# Patient Record
Sex: Female | Born: 1950 | Race: White | Hispanic: No | Marital: Married | Smoking: Never smoker
Health system: Southern US, Community
[De-identification: ages and names within clinical notes are randomized; demographics above are authoritative.]

## PROBLEM LIST (undated history)

## (undated) DIAGNOSIS — K219 Gastro-esophageal reflux disease without esophagitis: Secondary | ICD-10-CM

## (undated) DIAGNOSIS — E785 Hyperlipidemia, unspecified: Secondary | ICD-10-CM

## (undated) DIAGNOSIS — I10 Essential (primary) hypertension: Secondary | ICD-10-CM

## (undated) HISTORY — PX: REPLACEMENT TOTAL KNEE: SUR1224

## (undated) HISTORY — DX: Hyperlipidemia, unspecified: E78.5

## (undated) HISTORY — DX: Gastro-esophageal reflux disease without esophagitis: K21.9

## (undated) HISTORY — DX: Essential (primary) hypertension: I10

## (undated) HISTORY — PX: TUBAL LIGATION: SHX77

## (undated) HISTORY — PX: KNEE SURGERY: SHX244

## (undated) HISTORY — PX: CATARACT EXTRACTION: SUR2

## (undated) HISTORY — PX: OTHER SURGICAL HISTORY: SHX169

---

## 1995-06-21 HISTORY — PX: BREAST EXCISIONAL BIOPSY: SUR124

## 2015-06-02 DIAGNOSIS — J302 Other seasonal allergic rhinitis: Secondary | ICD-10-CM | POA: Insufficient documentation

## 2015-06-02 DIAGNOSIS — I10 Essential (primary) hypertension: Secondary | ICD-10-CM | POA: Insufficient documentation

## 2015-06-02 DIAGNOSIS — E785 Hyperlipidemia, unspecified: Secondary | ICD-10-CM | POA: Insufficient documentation

## 2015-06-02 DIAGNOSIS — M1990 Unspecified osteoarthritis, unspecified site: Secondary | ICD-10-CM | POA: Insufficient documentation

## 2015-06-02 DIAGNOSIS — K219 Gastro-esophageal reflux disease without esophagitis: Secondary | ICD-10-CM | POA: Insufficient documentation

## 2015-06-02 DIAGNOSIS — I83819 Varicose veins of unspecified lower extremities with pain: Secondary | ICD-10-CM | POA: Insufficient documentation

## 2015-06-02 DIAGNOSIS — Z79899 Other long term (current) drug therapy: Secondary | ICD-10-CM | POA: Insufficient documentation

## 2015-06-02 DIAGNOSIS — G43909 Migraine, unspecified, not intractable, without status migrainosus: Secondary | ICD-10-CM | POA: Insufficient documentation

## 2016-12-26 DIAGNOSIS — E7800 Pure hypercholesterolemia, unspecified: Secondary | ICD-10-CM | POA: Insufficient documentation

## 2016-12-26 DIAGNOSIS — I10 Essential (primary) hypertension: Secondary | ICD-10-CM | POA: Insufficient documentation

## 2016-12-26 DIAGNOSIS — G43009 Migraine without aura, not intractable, without status migrainosus: Secondary | ICD-10-CM | POA: Insufficient documentation

## 2017-05-18 ENCOUNTER — Other Ambulatory Visit: Payer: Self-pay | Admitting: Family Medicine

## 2017-05-18 DIAGNOSIS — Z1231 Encounter for screening mammogram for malignant neoplasm of breast: Secondary | ICD-10-CM

## 2017-06-14 ENCOUNTER — Encounter: Payer: Self-pay | Admitting: Radiology

## 2017-06-14 ENCOUNTER — Ambulatory Visit
Admission: RE | Admit: 2017-06-14 | Discharge: 2017-06-14 | Disposition: A | Discharge status: Home / Self Care | Payer: Medicare Other | Source: Ambulatory Visit | Attending: Family Medicine | Admitting: Family Medicine

## 2017-06-14 DIAGNOSIS — Z1231 Encounter for screening mammogram for malignant neoplasm of breast: Secondary | ICD-10-CM | POA: Insufficient documentation

## 2017-06-19 ENCOUNTER — Other Ambulatory Visit: Payer: Self-pay | Admitting: *Deleted

## 2017-06-19 ENCOUNTER — Inpatient Hospital Stay
Admission: RE | Admit: 2017-06-19 | Discharge: 2017-06-19 | Disposition: A | Discharge status: Home / Self Care | Payer: Self-pay | Source: Ambulatory Visit | Attending: *Deleted | Admitting: *Deleted

## 2017-06-19 DIAGNOSIS — Z9289 Personal history of other medical treatment: Secondary | ICD-10-CM

## 2018-05-08 ENCOUNTER — Other Ambulatory Visit: Payer: Self-pay | Admitting: Family Medicine

## 2018-05-08 DIAGNOSIS — Z1231 Encounter for screening mammogram for malignant neoplasm of breast: Secondary | ICD-10-CM

## 2018-06-15 ENCOUNTER — Ambulatory Visit
Admission: RE | Admit: 2018-06-15 | Discharge: 2018-06-15 | Disposition: A | Discharge status: Home / Self Care | Payer: Medicare Other | Source: Ambulatory Visit | Attending: Family Medicine | Admitting: Family Medicine

## 2018-06-15 DIAGNOSIS — Z1231 Encounter for screening mammogram for malignant neoplasm of breast: Secondary | ICD-10-CM | POA: Diagnosis not present

## 2019-07-31 ENCOUNTER — Other Ambulatory Visit: Payer: Self-pay | Admitting: Family Medicine

## 2019-07-31 DIAGNOSIS — Z1231 Encounter for screening mammogram for malignant neoplasm of breast: Secondary | ICD-10-CM

## 2019-10-15 ENCOUNTER — Ambulatory Visit
Admission: RE | Admit: 2019-10-15 | Discharge: 2019-10-15 | Disposition: A | Discharge status: Home / Self Care | Payer: Medicare Other | Source: Ambulatory Visit | Attending: Family Medicine | Admitting: Family Medicine

## 2019-10-15 DIAGNOSIS — Z1231 Encounter for screening mammogram for malignant neoplasm of breast: Secondary | ICD-10-CM | POA: Diagnosis not present

## 2020-09-18 ENCOUNTER — Other Ambulatory Visit: Payer: Self-pay | Admitting: Family Medicine

## 2020-09-18 DIAGNOSIS — Z1231 Encounter for screening mammogram for malignant neoplasm of breast: Secondary | ICD-10-CM

## 2020-10-15 ENCOUNTER — Ambulatory Visit
Admission: RE | Admit: 2020-10-15 | Discharge: 2020-10-15 | Disposition: A | Discharge status: Home / Self Care | Payer: Medicare Other | Source: Ambulatory Visit | Attending: Family Medicine | Admitting: Family Medicine

## 2020-10-15 ENCOUNTER — Other Ambulatory Visit: Payer: Self-pay

## 2020-10-15 DIAGNOSIS — Z1231 Encounter for screening mammogram for malignant neoplasm of breast: Secondary | ICD-10-CM | POA: Diagnosis present

## 2021-03-25 ENCOUNTER — Telehealth: Payer: Self-pay

## 2021-03-25 ENCOUNTER — Encounter: Payer: Self-pay | Admitting: Internal Medicine

## 2021-03-25 ENCOUNTER — Other Ambulatory Visit: Payer: Self-pay

## 2021-03-25 ENCOUNTER — Ambulatory Visit (INDEPENDENT_AMBULATORY_CARE_PROVIDER_SITE_OTHER): Payer: Medicare Other | Admitting: Nurse Practitioner

## 2021-03-25 VITALS — BP 140/80 | HR 77 | Temp 98.2°F | Resp 16 | Ht 63.5 in | Wt 164.0 lb

## 2021-03-25 DIAGNOSIS — Z780 Asymptomatic menopausal state: Secondary | ICD-10-CM | POA: Diagnosis not present

## 2021-03-25 DIAGNOSIS — Z7689 Persons encountering health services in other specified circumstances: Secondary | ICD-10-CM

## 2021-03-25 DIAGNOSIS — E559 Vitamin D deficiency, unspecified: Secondary | ICD-10-CM | POA: Diagnosis not present

## 2021-03-25 DIAGNOSIS — E538 Deficiency of other specified B group vitamins: Secondary | ICD-10-CM

## 2021-03-25 DIAGNOSIS — Z23 Encounter for immunization: Secondary | ICD-10-CM

## 2021-03-25 DIAGNOSIS — R600 Localized edema: Secondary | ICD-10-CM | POA: Diagnosis not present

## 2021-03-25 DIAGNOSIS — E782 Mixed hyperlipidemia: Secondary | ICD-10-CM

## 2021-03-25 MED ORDER — TETANUS-DIPHTH-ACELL PERTUSSIS 5-2-15.5 LF-MCG/0.5 IM SUSP: 0.5000 mL | Freq: Once | INTRAMUSCULAR | 0 refills | Status: AC

## 2021-03-25 NOTE — Telephone Encounter (Signed)
scheduled bone density with Jordan Carpenter @ [facility] for 04/15/21 @ 2:20. I notified patient-Jordan Carpenter

## 2021-03-25 NOTE — Progress Notes (Signed)
[facility] [address] [place], [place] [address]  Internal MEDICINE  Office Visit Note  Patient Name: Jordan Carpenter  04/30/1951  [id number]  Date of Service: 03/25/2021   Complaints/HPI Pt is here for establishment of PCP. Chief Complaint  Patient presents with   New Patient (Initial Visit)   Hyperlipidemia   Hypertension   HPI Brendan presents for a new patient visit to establish care. She has a history of hypertension, hyperlipidemia and GERD. Her surgical history is significant for bilateral cataract extraction, back surgery in 2004, left knee surgery x2, and a negative right breast biopsy in 1997. She has also had a tubal ligation. She lives at home with her husband. She has 2 grown sons and 1 grown daughter. She has 4 grandchildren. 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 She reports that her diet is ok and she walks for exercise. She is a nonsmoker, drinks wine occasionally and denies any recreational drug use. She is waiting until November to get the flu vaccine. She has had 4 doses of the covid vaccine. She had a routine colonoscopy in 2021.  He has chronic swelling in her lower legs, she has been told that it is lymphedema. It ha been an issue since she has had knee surgery on both knees.  She is due for BMD screening, routine labs and the tetanus vaccine.     ABI at next visit, and consider vas surg or lymphedema.   Current Medication: Outpatient Encounter Medications as of 03/25/2021  Medication Sig   cetirizine (ZYRTEC) 10 MG tablet Take by mouth.   Coenzyme Q10 400 MG CAPS Take by mouth.   cyanocobalamin 1000 MCG tablet Take by mouth.   Elderberry 500 MG CAPS Take by mouth.   fluticasone (FLONASE) 50 MCG/ACT nasal spray    lisinopril-hydrochlorothiazide (ZESTORETIC) 20-25 MG tablet Take 1 tablet by mouth daily.   pantoprazole (PROTONIX) 40 MG tablet Take 1 tablet by mouth 2 (two) times daily.   rosuvastatin (CRESTOR) 40 MG tablet    [EXPIRED] Tdap (ADACEL) 10-19-13.5  LF-MCG/0.5 injection Inject 0.5 mLs into the muscle once for 1 dose.   [DISCONTINUED] COVID-19 mRNA vaccine, Moderna, 100 MCG/0.5ML injection    No facility-administered encounter medications on file as of 03/25/2021.    Surgical History: Past Surgical History:  Procedure Laterality Date   BACK SURGERY Left    2004   BREAST EXCISIONAL BIOPSY Right 1997   neg   CATARACT EXTRACTION Bilateral    eye lift Left    KNEE SURGERY Left    2007 AGAIN   REPLACEMENT TOTAL KNEE     TUBAL LIGATION      Medical History: Past Medical History:  Diagnosis Date   GERD (gastroesophageal reflux disease)    Hyperlipidemia    Hypertension     Family History: Family History  Problem Relation Age of Onset   Lung cancer Mother    Lymphoma Father    Lung cancer Brother    Breast cancer Maternal Aunt 60   Ovarian cancer Paternal Aunt    Kidney cancer Maternal Grandmother    Breast cancer Paternal Grandmother     Social History   Socioeconomic History   Marital status: Married    Spouse name: Not on file   Number of children: Not on file   Years of education: Not on file   Highest education level: Not on file  Occupational History   Not on file  Tobacco Use   Smoking status: Never      Passive exposure: Never   Smokeless tobacco: Never  Substance and Sexual Activity   Alcohol use: Yes    Alcohol/week: 1.0 standard drink    Types: 1 Glasses of wine per week   Drug use: Not on file   Sexual activity: Not on file  Other Topics Concern   Not on file  Social History Narrative   Not on file   Social Determinants of Health   Financial Resource Strain: Not on file  Food Insecurity: Not on file  Transportation Needs: Not on file  Physical Activity: Not on file  Stress: Not on file  Social Connections: Not on file  Intimate Partner Violence: Not on file     Review of Systems  Constitutional:  Negative for chills, fatigue and unexpected weight change.  HENT:  Negative for  congestion, rhinorrhea, sneezing and sore throat.   Eyes:  Negative for redness.  Respiratory:  Negative for cough, chest tightness and shortness of breath.   Cardiovascular:  Negative for chest pain and palpitations.  Gastrointestinal:  Negative for abdominal pain, constipation, diarrhea, nausea and vomiting.  Genitourinary:  Negative for dysuria and frequency.  Musculoskeletal:  Positive for arthralgias, back pain and gait problem. Negative for joint swelling and neck pain.  Skin:  Negative for rash.  Neurological:  Negative for tremors and numbness.  Hematological:  Negative for adenopathy. Does not bruise/bleed easily.  Psychiatric/Behavioral:  Negative for behavioral problems (Depression), sleep disturbance and suicidal ideas. The patient is not nervous/anxious.    Vital Signs: BP 140/80   Pulse 77   Temp 98.2 F (36.8 C)   Resp 16   Ht 5' 3.5" (1.613 m)   Wt 164 lb (74.4 kg)   SpO2 97%   BMI 28.60 kg/m    Physical Exam Vitals reviewed.  Constitutional:      General: She is not in acute distress.    Appearance: Normal appearance. She is normal weight. She is not ill-appearing.  HENT:     Head: Normocephalic and atraumatic.  Eyes:     Extraocular Movements: Extraocular movements intact.     Pupils: Pupils are equal, round, and reactive to light.  Pulmonary:     Effort: Pulmonary effort is normal. No respiratory distress.  Musculoskeletal:     Right lower leg: Edema present.     Left lower leg: Edema present.  Neurological:     Mental Status: She is alert and oriented to person, place, and time.     Cranial Nerves: No cranial nerve deficit.     Coordination: Coordination normal.     Gait: Gait normal.  Psychiatric:        Mood and Affect: Mood normal.        Behavior: Behavior normal.      Assessment/Plan: 1. Bilateral lower extremity edema Chronic swelling since her knee surgeries. Will do ABIs at next office visit, and consider consult to wound clinic for  lymphedema consultation.   2. Asymptomatic menopausal state BMD screening due, imaging ordered. Routine labs ordered.  - DG Bone Density; Future - CBC with Differential/Platelet - CMP14+EGFR - TSH + free T4  3. Vitamin D deficiency Rule out low vitamin D.  - Vitamin D (25 hydroxy)  4. B12 deficiency Routine labs ordered - CBC with Differential/Platelet - B12 and Folate Panel  5. Mixed hyperlipidemia Routine labs ordered - CMP14+EGFR - Lipid Profile  6. Need for tetanus booster - Tdap (ADACEL) 10-19-13.5 LF-MCG/0.5 injection; Inject 0.5 mLs into the muscle once for 1   dose.  Dispense: 0.5 mL; Refill: 0  7. Encounter to establish care with new doctor Establish with new PCP.    General Counseling: Kei verbalizes understanding of the findings of todays visit and agrees with plan of treatment. I have discussed any further diagnostic evaluation that may be needed or ordered today. We also reviewed her medications today. she has been encouraged to call the office with any questions or concerns that should arise related to todays visit.    Counseling:  [place] Controlled Substance Database was reviewed by me.  Orders Placed This Encounter  Procedures   DG Bone Density   CBC with Differential/Platelet   CMP14+EGFR   Lipid Profile   TSH + free T4   Vitamin D (25 hydroxy)   B12 and Folate Panel    Meds ordered this encounter  Medications   Tdap (ADACEL) 10-19-13.5 LF-MCG/0.5 injection    Sig: Inject 0.5 mLs into the muscle once for 1 dose.    Dispense:  0.5 mL    Refill:  0    Return in about 4 weeks (around 04/22/2021) for CPE, Review labs/test, Alyssa PCP.  Time spent:30 Minutes  This patient was seen by Alyssa Abernathy, FNP-C in collaboration with Dr. Fozia Khan as a part of collaborative care agreement.    Alyssa R. Abernathy, MSN, FNP-C Internal Medicine  

## 2021-03-30 LAB — CMP14+EGFR
ALT: 19 IU/L (ref 0–32)
AST: 22 IU/L (ref 0–40)
Albumin/Globulin Ratio: 1.9 (ref 1.2–2.2)
Albumin: 4.3 g/dL (ref 3.7–4.7)
Alkaline Phosphatase: 84 IU/L (ref 44–121)
BUN/Creatinine Ratio: 26 (ref 12–28)
BUN: 18 mg/dL (ref 8–27)
Bilirubin Total: 0.6 mg/dL (ref 0.0–1.2)
CO2: 26 mmol/L (ref 20–29)
Calcium: 9.7 mg/dL (ref 8.7–10.3)
Chloride: 97 mmol/L (ref 96–106)
Creatinine, Ser: 0.68 mg/dL (ref 0.57–1.00)
Globulin, Total: 2.3 g/dL (ref 1.5–4.5)
Glucose: 82 mg/dL (ref 70–99)
Potassium: 3.8 mmol/L (ref 3.5–5.2)
Sodium: 136 mmol/L (ref 134–144)
Total Protein: 6.6 g/dL (ref 6.0–8.5)
eGFR: 93 mL/min/{1.73_m2} (ref >59)

## 2021-03-30 LAB — CBC WITH DIFFERENTIAL/PLATELET
Basophils Absolute: 0.1 10*3/uL (ref 0.0–0.2)
Basos: 1 %
EOS (ABSOLUTE): 0.1 10*3/uL (ref 0.0–0.4)
Eos: 1 %
Hematocrit: 42.6 % (ref 34.0–46.6)
Hemoglobin: 14 g/dL (ref 11.1–15.9)
Immature Grans (Abs): 0 10*3/uL (ref 0.0–0.1)
Immature Granulocytes: 0 %
Lymphocytes Absolute: 1.8 10*3/uL (ref 0.7–3.1)
Lymphs: 27 %
MCH: 27.8 pg (ref 26.6–33.0)
MCHC: 32.9 g/dL (ref 31.5–35.7)
MCV: 85 fL (ref 79–97)
Monocytes Absolute: 0.4 10*3/uL (ref 0.1–0.9)
Monocytes: 7 %
Neutrophils Absolute: 4.1 10*3/uL (ref 1.4–7.0)
Neutrophils: 64 %
Platelets: 241 10*3/uL (ref 150–450)
RBC: 5.04 x10E6/uL (ref 3.77–5.28)
RDW: 13.2 % (ref 11.7–15.4)
WBC: 6.5 10*3/uL (ref 3.4–10.8)

## 2021-03-30 LAB — LIPID PANEL
Chol/HDL Ratio: 2.8 ratio (ref 0.0–4.4)
Cholesterol, Total: 236 mg/dL — ABNORMAL HIGH (ref 100–199)
HDL: 84 mg/dL (ref >39)
LDL Chol Calc (NIH): 140 mg/dL — ABNORMAL HIGH (ref 0–99)
Triglycerides: 73 mg/dL (ref 0–149)
VLDL Cholesterol Cal: 12 mg/dL (ref 5–40)

## 2021-03-30 LAB — B12 AND FOLATE PANEL
Folate: 11.6 ng/mL (ref >3.0)
Vitamin B-12: 1333 pg/mL — ABNORMAL HIGH (ref 232–1245)

## 2021-03-30 LAB — VITAMIN D 25 HYDROXY (VIT D DEFICIENCY, FRACTURES): Vit D, 25-Hydroxy: 52 ng/mL (ref 30.0–100.0)

## 2021-03-30 LAB — TSH+FREE T4
Free T4: 1.4 ng/dL (ref 0.82–1.77)
TSH: 1.31 u[IU]/mL (ref 0.450–4.500)

## 2021-04-14 NOTE — Progress Notes (Signed)
I have reviewed the lab results. There are no critically abnormal values requiring immediate intervention but there are some abnormals that will be discussed at the next office visit.   The lipid levels are abnormal. Some interventions that can be done to improve lipid levels include eating leaner proteins, decreasing red meats in diet, adding an OTC fish oil supplement, weight loss, and increasing physical activity. Elevated lipid levels can increase the risk of atherosclerotic cardiovascular disease as well so moderate or high intensity statin therapy may also be recommended.   We will discuss in detail at your upcoming office visit on 11/10

## 2021-04-15 ENCOUNTER — Other Ambulatory Visit: Payer: Medicare Other

## 2021-04-29 ENCOUNTER — Ambulatory Visit: Payer: Medicare Other | Admitting: Nurse Practitioner

## 2021-05-06 ENCOUNTER — Other Ambulatory Visit: Payer: Self-pay

## 2021-05-06 ENCOUNTER — Ambulatory Visit
Admission: RE | Admit: 2021-05-06 | Discharge: 2021-05-06 | Disposition: A | Discharge status: Home / Self Care | Payer: Medicare Other | Source: Ambulatory Visit | Attending: Nurse Practitioner | Admitting: Nurse Practitioner

## 2021-05-06 DIAGNOSIS — Z780 Asymptomatic menopausal state: Secondary | ICD-10-CM | POA: Diagnosis present

## 2021-07-20 NOTE — Progress Notes (Signed)
Will discuss with patient at next office visit. 

## 2021-07-26 ENCOUNTER — Ambulatory Visit (INDEPENDENT_AMBULATORY_CARE_PROVIDER_SITE_OTHER): Payer: Medicare Other | Admitting: Nurse Practitioner

## 2021-07-26 ENCOUNTER — Encounter: Payer: Self-pay | Admitting: Nurse Practitioner

## 2021-07-26 ENCOUNTER — Other Ambulatory Visit: Payer: Self-pay

## 2021-07-26 VITALS — BP 106/78 | HR 70 | Temp 98.7°F | Resp 16 | Ht 64.0 in | Wt 168.6 lb

## 2021-07-26 DIAGNOSIS — I10 Essential (primary) hypertension: Secondary | ICD-10-CM | POA: Diagnosis not present

## 2021-07-26 DIAGNOSIS — K219 Gastro-esophageal reflux disease without esophagitis: Secondary | ICD-10-CM

## 2021-07-26 DIAGNOSIS — Z0001 Encounter for general adult medical examination with abnormal findings: Secondary | ICD-10-CM | POA: Diagnosis not present

## 2021-07-26 DIAGNOSIS — R0989 Other specified symptoms and signs involving the circulatory and respiratory systems: Secondary | ICD-10-CM | POA: Diagnosis not present

## 2021-07-26 DIAGNOSIS — E782 Mixed hyperlipidemia: Secondary | ICD-10-CM | POA: Diagnosis not present

## 2021-07-26 DIAGNOSIS — I8393 Asymptomatic varicose veins of bilateral lower extremities: Secondary | ICD-10-CM | POA: Diagnosis not present

## 2021-07-26 DIAGNOSIS — R300 Dysuria: Secondary | ICD-10-CM

## 2021-07-26 MED ORDER — ROSUVASTATIN CALCIUM 40 MG PO TABS: 40.0000 mg | ORAL_TABLET | Freq: Every day | ORAL | 1 refills | Status: DC

## 2021-07-26 MED ORDER — LISINOPRIL-HYDROCHLOROTHIAZIDE 20-25 MG PO TABS: 1.0000 | ORAL_TABLET | Freq: Every day | ORAL | 1 refills | Status: DC

## 2021-07-26 MED ORDER — PANTOPRAZOLE SODIUM 40 MG PO TBEC: 40.0000 mg | DELAYED_RELEASE_TABLET | Freq: Two times a day (BID) | ORAL | 1 refills | Status: DC

## 2021-07-26 NOTE — Progress Notes (Signed)
[facility] [place], [place] [address]  Internal MEDICINE  Office Visit Note  Patient Name: Jordan Carpenter  [id number]  [id number]  Date of Service: 07/26/2021  Chief Complaint  Patient presents with   Medicare Wellness    Legs swelling, wants vains on legs looked at   Gastroesophageal Reflux   Hyperlipidemia   Hypertension   Results    HPI Jordan Carpenter presents for an annual well visit and physical exam. She is a well appearing 71 yo female with hypertension and hyperlipidemia. She started a fish oil supplement. Her bone density scan in November showed osteoporosis but patient does not want to take biphosphonates. She recently got her tetanus booster.  She has some swollen varicose veins in her legs and wants to see a specialist.  Her blood pressure is well controlled.  Her last mammogram was April 2022 and it was normal. Her last colonoscopy was 2021.  She has no other questions or concerns, she denies any pain.      Current Medication: Outpatient Encounter Medications as of 07/26/2021  Medication Sig   cetirizine (ZYRTEC) 10 MG tablet Take by mouth.   Coenzyme Q10 400 MG CAPS Take by mouth.   cyanocobalamin 1000 MCG tablet Take by mouth.   Elderberry 500 MG CAPS Take by mouth.   fluticasone (FLONASE) 50 MCG/ACT nasal spray    SUMAtriptan (IMITREX) 20 MG/ACT nasal spray Place into the nose.   [DISCONTINUED] lisinopril-hydrochlorothiazide (ZESTORETIC) 20-25 MG tablet Take 1 tablet by mouth daily.   [DISCONTINUED] pantoprazole (PROTONIX) 40 MG tablet Take 1 tablet by mouth 2 (two) times daily.   [DISCONTINUED] rosuvastatin (CRESTOR) 40 MG tablet    Cholecalciferol 25 MCG (1000 UT) capsule Take by mouth.   lisinopril-hydrochlorothiazide (ZESTORETIC) 20-25 MG tablet Take 1 tablet by mouth daily.   pantoprazole (PROTONIX) 40 MG tablet Take 1 tablet (40 mg total) by mouth 2 (two) times daily.   rosuvastatin (CRESTOR) 40 MG tablet Take 1 tablet (40 mg total) by mouth  daily.   No facility-administered encounter medications on file as of 07/26/2021.    Surgical History: Past Surgical History:  Procedure Laterality Date   BACK SURGERY Left    2004   BREAST EXCISIONAL BIOPSY Right 1997   neg   CATARACT EXTRACTION Bilateral    eye lift Left    KNEE SURGERY Left    2007 AGAIN   REPLACEMENT TOTAL KNEE     TUBAL LIGATION      Medical History: Past Medical History:  Diagnosis Date   GERD (gastroesophageal reflux disease)    Hyperlipidemia    Hypertension     Family History: Family History  Problem Relation Age of Onset   Lung cancer Mother    Lymphoma Father    Lung cancer Brother    Breast cancer Maternal Aunt 60   Ovarian cancer Paternal Aunt    Kidney cancer Maternal Grandmother    Breast cancer Paternal Grandmother     Social History   Socioeconomic History   Marital status: Married    Spouse name: Not on file   Number of children: Not on file   Years of education: Not on file   Highest education level: Not on file  Occupational History   Not on file  Tobacco Use   Smoking status: Never    Passive exposure: Never   Smokeless tobacco: Never  Substance and Sexual Activity   Alcohol use: Yes    Alcohol/week: 1.0 standard drink  Types: 1 Glasses of wine per week   Drug use: Never   Sexual activity: Not on file  Other Topics Concern   Not on file  Social History Narrative   Not on file   Social Determinants of Health   Financial Resource Strain: Not on file  Food Insecurity: Not on file  Transportation Needs: Not on file  Physical Activity: Not on file  Stress: Not on file  Social Connections: Not on file  Intimate Partner Violence: Not on file      Review of Systems  Constitutional:  Negative for activity change, appetite change, chills, fatigue, fever and unexpected weight change.  HENT: Negative.  Negative for congestion, ear pain, rhinorrhea, sore throat and trouble swallowing.   Eyes: Negative.    Respiratory: Negative.  Negative for cough, chest tightness, shortness of breath and wheezing.   Cardiovascular: Negative.  Negative for chest pain.  Gastrointestinal: Negative.  Negative for abdominal pain, blood in stool, constipation, diarrhea, nausea and vomiting.  Endocrine: Negative.   Genitourinary: Negative.  Negative for difficulty urinating, dysuria, frequency, hematuria and urgency.  Musculoskeletal: Negative.  Negative for arthralgias, back pain, joint swelling, myalgias and neck pain.  Skin: Negative.  Negative for rash and wound.  Allergic/Immunologic: Negative.  Negative for immunocompromised state.  Neurological: Negative.  Negative for dizziness, seizures, numbness and headaches.  Hematological: Negative.   Psychiatric/Behavioral: Negative.  Negative for behavioral problems, self-injury and suicidal ideas. The patient is not nervous/anxious.    Vital Signs: BP 106/78    Pulse 70    Temp 98.7 F (37.1 C)    Resp 16    Ht 5\' 4"  (1.626 m)    Wt 168 lb 9.6 oz (76.5 kg)    SpO2 99%    BMI 28.94 kg/m    Physical Exam Vitals reviewed.  Constitutional:      General: She is awake. She is not in acute distress.    Appearance: Normal appearance. She is well-developed, well-groomed and overweight. She is not diaphoretic.  HENT:     Head: Normocephalic and atraumatic.     Right Ear: External ear normal.     Left Ear: External ear normal.     Nose: Nose normal.     Mouth/Throat:     Lips: Pink.     Pharynx: Oropharynx is clear. Uvula midline. No oropharyngeal exudate.  Eyes:     General: Lids are normal. Vision grossly intact. Gaze aligned appropriately. No scleral icterus.       Right eye: No discharge.        Left eye: No discharge.     Conjunctiva/sclera: Conjunctivae normal.     Pupils: Pupils are equal, round, and reactive to light.     Funduscopic exam:    Right eye: Red reflex present.        Left eye: Red reflex present. Neck:     Thyroid: No thyromegaly.      Vascular: Carotid bruit present. No JVD.     Trachea: No tracheal deviation.  Cardiovascular:     Rate and Rhythm: Normal rate and regular rhythm.     Pulses:          Carotid pulses are 3+ on the right side with bruit and 3+ on the left side with bruit.    Heart sounds: Normal heart sounds, S1 normal and S2 normal. No murmur heard.   No friction rub. No gallop.  Pulmonary:     Effort: Pulmonary effort is normal. No accessory  muscle usage or respiratory distress.     Breath sounds: Normal breath sounds and air entry. No stridor. No wheezing or rales.  Chest:     Chest wall: No tenderness.     Comments: Declined breast exam Abdominal:     General: Bowel sounds are normal. There is no distension.     Palpations: Abdomen is soft. There is no shifting dullness, fluid wave, mass or pulsatile mass.     Tenderness: There is no abdominal tenderness. There is no guarding or rebound.  Musculoskeletal:        General: No tenderness or deformity. Normal range of motion.     Cervical back: Normal range of motion and neck supple.     Right lower leg: 1+ Pitting Edema present.     Left lower leg: 1+ Pitting Edema present.  Lymphadenopathy:     Cervical: No cervical adenopathy.  Skin:    General: Skin is warm and dry.     Coloration: Skin is not pale.     Findings: No erythema or rash.  Neurological:     Mental Status: She is alert.     Cranial Nerves: No cranial nerve deficit.     Motor: No abnormal muscle tone.     Coordination: Coordination normal.     Deep Tendon Reflexes: Reflexes are normal and symmetric.  Psychiatric:        Behavior: Behavior normal. Behavior is cooperative.        Thought Content: Thought content normal.        Judgment: Judgment normal.       Assessment/Plan: 1. Encounter for routine adult health examination with abnormal findings Age-appropriate preventive screenings and vaccinations discussed, annual physical exam completed. Routine labs for health  maintenance drawn previously, discussed with patient. PHM updated.   2. Bilateral carotid bruits Bilateral bruits auscultated on exam, carotid ultrasound ordered.  - US Carotid Duplex Bilateral; Future  3. Primary hypertension Stable, continue medication as prescribed.  - lisinopril-hydrochlorothiazide (ZESTORETIC) 20-25 MG tablet; Take 1 tablet by mouth daily.  Dispense: 90 tablet; Refill: 1  4. Asymptomatic varicose veins of both lower extremities Referred to vascular specialist as requested.  - Ambulatory referral to Vascular Surgery  5. Mixed hyperlipidemia Patient started takine OTC fish oil supplement. Continue rosuvastatin as prescribed.  - rosuvastatin (CRESTOR) 40 MG tablet; Take 1 tablet (40 mg total) by mouth daily.  Dispense: 90 tablet; Refill: 1  6. Gastroesophageal reflux disease without esophagitis Stable, continue current medication as prescribed.  - pantoprazole (PROTONIX) 40 MG tablet; Take 1 tablet (40 mg total) by mouth 2 (two) times daily.  Dispense: 90 tablet; Refill: 1  7. Dysuria Routine urinalysis done - UA/M w/rflx Culture, Routine - Microscopic Examination      General Counseling: Nallely verbalizes understanding of the findings of todays visit and agrees with plan of treatment. I have discussed any further diagnostic evaluation that may be needed or ordered today. We also reviewed her medications today. she has been encouraged to call the office with any questions or concerns that should arise related to todays visit.    Orders Placed This Encounter  Procedures   US Carotid Duplex Bilateral   Ambulatory referral to Vascular Surgery    Meds ordered this encounter  Medications   lisinopril-hydrochlorothiazide (ZESTORETIC) 20-25 MG tablet    Sig: Take 1 tablet by mouth daily.    Dispense:  90 tablet    Refill:  1   pantoprazole (PROTONIX) 40 MG tablet  Sig: Take 1 tablet (40 mg total) by mouth 2 (two) times daily.    Dispense:  90 tablet     Refill:  1   rosuvastatin (CRESTOR) 40 MG tablet    Sig: Take 1 tablet (40 mg total) by mouth daily.    Dispense:  90 tablet    Refill:  1    Return in about 1 month (around 08/23/2021) for F/U, U/S @ [facility] schedule with DFK please.   Total time spent:30 Minutes Time spent includes review of chart, medications, test results, and follow up plan with the patient.   [place] Controlled Substance Database was reviewed by me.  This patient was seen by Jonetta Osgood, FNP-C in collaboration with Dr. Clayborn Bigness as a part of collaborative care agreement.  Zaya Kessenich R. Valetta Fuller, MSN, FNP-C Internal medicine

## 2021-07-27 LAB — UA/M W/RFLX CULTURE, ROUTINE
Bilirubin, UA: NEGATIVE
Glucose, UA: NEGATIVE
Ketones, UA: NEGATIVE
Leukocytes,UA: NEGATIVE
Nitrite, UA: NEGATIVE
Protein,UA: NEGATIVE
RBC, UA: NEGATIVE
Specific Gravity, UA: 1.014 (ref 1.005–1.030)
Urobilinogen, Ur: 0.2 mg/dL (ref 0.2–1.0)
pH, UA: 6.5 (ref 5.0–7.5)

## 2021-07-27 LAB — MICROSCOPIC EXAMINATION
Bacteria, UA: NONE SEEN
Casts: NONE SEEN /lpf
Epithelial Cells (non renal): NONE SEEN /hpf (ref 0–10)
RBC, Urine: NONE SEEN /hpf (ref 0–2)
WBC, UA: NONE SEEN /hpf (ref 0–5)

## 2021-08-01 ENCOUNTER — Encounter: Payer: Self-pay | Admitting: Nurse Practitioner

## 2021-08-11 ENCOUNTER — Other Ambulatory Visit: Payer: Self-pay

## 2021-08-11 ENCOUNTER — Ambulatory Visit (INDEPENDENT_AMBULATORY_CARE_PROVIDER_SITE_OTHER): Payer: Medicare Other

## 2021-08-11 DIAGNOSIS — R0989 Other specified symptoms and signs involving the circulatory and respiratory systems: Secondary | ICD-10-CM

## 2021-08-12 ENCOUNTER — Encounter (INDEPENDENT_AMBULATORY_CARE_PROVIDER_SITE_OTHER): Payer: Self-pay | Admitting: Vascular Surgery

## 2021-08-12 ENCOUNTER — Ambulatory Visit (INDEPENDENT_AMBULATORY_CARE_PROVIDER_SITE_OTHER): Payer: Medicare Other | Admitting: Vascular Surgery

## 2021-08-12 VITALS — BP 126/77 | HR 69 | Ht 64.0 in | Wt 168.0 lb

## 2021-08-12 DIAGNOSIS — I83813 Varicose veins of bilateral lower extremities with pain: Secondary | ICD-10-CM

## 2021-08-12 DIAGNOSIS — K219 Gastro-esophageal reflux disease without esophagitis: Secondary | ICD-10-CM

## 2021-08-12 DIAGNOSIS — I10 Essential (primary) hypertension: Secondary | ICD-10-CM | POA: Diagnosis not present

## 2021-08-12 DIAGNOSIS — I890 Lymphedema, not elsewhere classified: Secondary | ICD-10-CM | POA: Insufficient documentation

## 2021-08-12 DIAGNOSIS — I872 Venous insufficiency (chronic) (peripheral): Secondary | ICD-10-CM | POA: Insufficient documentation

## 2021-08-12 NOTE — Progress Notes (Signed)
MRN : [id number]  Jordan Carpenter is a 71 y.o. (14-Apr-1951) female who presents with chief complaint of varicose veins with pain.  History of Present Illness: The patient is seen for evaluation of symptomatic varicose veins. The patient relates burning and stinging which worsened steadily throughout the course of the day, particularly with standing. The patient also notes an aching and throbbing pain (also a heaviness), over the varicosities, particularly with prolonged dependent positions. The symptoms are significantly improved with elevation.  The patient states the pain from the varicose veins interferes with work, daily exercise, shopping and household maintenance. At this point, the symptoms are persistent and severe enough that they're having a negative impact on lifestyle and are interfering with daily activities.  There is no history of DVT, PE or superficial thrombophlebitis. There is no history of ulceration or hemorrhage. The patient denies a significant family history of varicose veins.  The patient has not worn graduated compression in the past. At the present time the patient has not been using over-the-counter analgesics. There is no history of prior surgical intervention or sclerotherapy.    Current Meds  Medication Sig   cetirizine (ZYRTEC) 10 MG tablet Take by mouth.   Cholecalciferol 25 MCG (1000 UT) capsule Take by mouth.   CINNAMON PO Take 600 mg by mouth in the morning and at bedtime.   Coenzyme Q10 400 MG CAPS Take by mouth.   cyanocobalamin 1000 MCG tablet Take by mouth.   Elderberry 500 MG CAPS Take by mouth.   fluticasone (FLONASE) 50 MCG/ACT nasal spray    lisinopril-hydrochlorothiazide (ZESTORETIC) 20-25 MG tablet Take 1 tablet by mouth daily.   pantoprazole (PROTONIX) 40 MG tablet Take 1 tablet (40 mg total) by mouth 2 (two) times daily.   ranitidine (ZANTAC) 300 MG tablet    rosuvastatin (CRESTOR) 40 MG tablet Take 1 tablet (40 mg total) by mouth daily.    SUMAtriptan (IMITREX) 20 MG/ACT nasal spray Place into the nose.    Past Medical History:  Diagnosis Date   GERD (gastroesophageal reflux disease)    Hyperlipidemia    Hypertension     Past Surgical History:  Procedure Laterality Date   BACK SURGERY Left    2004   BREAST EXCISIONAL BIOPSY Right 1997   neg   CATARACT EXTRACTION Bilateral    eye lift Left    KNEE SURGERY Left    2007 AGAIN   REPLACEMENT TOTAL KNEE     TUBAL LIGATION      Social History Social History   Tobacco Use   Smoking status: Never    Passive exposure: Never   Smokeless tobacco: Never  Substance Use Topics   Alcohol use: Yes    Alcohol/week: 1.0 standard drink    Types: 1 Glasses of wine per week   Drug use: Never    Family History Family History  Problem Relation Age of Onset   Lung cancer Mother    Lymphoma Father    Lung cancer Brother    Breast cancer Maternal Aunt 1   Ovarian cancer Paternal Aunt    Kidney cancer Maternal Grandmother    Breast cancer Paternal Grandmother     Allergies  Allergen Reactions   Sulfa Antibiotics Rash   Sulfamethoxazole-Trimethoprim Rash     REVIEW OF SYSTEMS (Negative unless checked)  Constitutional: [] Weight loss  [] Fever  [] Chills Cardiac: [] Chest pain   [] Chest pressure   [] Palpitations   [] Shortness of breath when laying flat   [] Shortness of breath  with exertion. Vascular:  [] Pain in legs with walking   [x] Pain in legs at rest  [] History of DVT   [] Phlebitis   [x] Swelling in legs   [x] Varicose veins   [] Non-healing ulcers Pulmonary:   [] Uses home oxygen   [] Productive cough   [] Hemoptysis   [] Wheeze  [] COPD   [] Asthma Neurologic:  [] Dizziness   [] Seizures   [] History of stroke   [] History of TIA  [] Aphasia   [] Vissual changes   [] Weakness or numbness in arm   [] Weakness or numbness in leg Musculoskeletal:   [] Joint swelling   [] Joint pain   [] Low back pain Hematologic:  [] Easy bruising  [] Easy bleeding   [] Hypercoagulable state    [] Anemic Gastrointestinal:  [] Diarrhea   [] Vomiting  [x] Gastroesophageal reflux/heartburn   [] Difficulty swallowing. Genitourinary:  [] Chronic kidney disease   [] Difficult urination  [] Frequent urination   [] Blood in urine Skin:  [] Rashes   [] Ulcers  Psychological:  [] History of anxiety   []  History of major depression.  Physical Examination  Vitals:   08/12/21 0952  BP: 126/77  Pulse: 69  Weight: 168 lb (76.2 kg)  Height: 5\' 4"  (1.626 m)   Body mass index is 28.84 kg/m. Gen: WD/WN, NAD Head: [place]/AT, No temporalis wasting.  Ear/Nose/Throat: Hearing grossly intact, nares w/o erythema or drainage, pinna without lesions Eyes: PER, EOMI, sclera nonicteric.  Neck: Supple, no gross masses.  No JVD.  Pulmonary:  Good air movement, no audible wheezing, no use of accessory muscles.  Cardiac: RRR, precordium not hyperdynamic. Vascular: Large varicosities present extensively greater than 10 mm bilateral.  Mild venous stasis changes to the legs bilaterally.  2+ soft pitting edema  Vessel Right Left  Radial Palpable Palpable  Gastrointestinal: soft, non-distended. No guarding/no peritoneal signs.  Musculoskeletal: M/S 5/5 throughout.  No deformity.  Neurologic: CN 2-12 intact. Pain and light touch intact in extremities.  Symmetrical.  Speech is fluent. Motor exam as listed above. Psychiatric: Judgment intact, Mood & affect appropriate for pt's clinical situation. Dermatologic: Venous rashes no ulcers noted.  No changes consistent with cellulitis. Lymph : No lichenification or skin changes of chronic lymphedema.  CBC Lab Results  Component Value Date   WBC 6.5 03/29/2021   HGB 14.0 03/29/2021   HCT 42.6 03/29/2021   MCV 85 03/29/2021   PLT 241 03/29/2021    BMET    Component Value Date/Time   NA 136 03/29/2021 0846   K 3.8 03/29/2021 0846   CL 97 03/29/2021 0846   CO2 26 03/29/2021 0846   GLUCOSE 82 03/29/2021 0846   BUN 18 03/29/2021 0846   CREATININE 0.68 03/29/2021 0846    CALCIUM 9.7 03/29/2021 0846   CrCl cannot be calculated (Patient's most recent lab result is older than the maximum 21 days allowed.).  COAG No results found for: INR, PROTIME  Radiology No results found.   Assessment/Plan 1. Varicose veins of both lower extremities with pain  Recommend:  The patient has large symptomatic varicose veins that are painful and associated with swelling.  I have had a long discussion with the patient regarding  varicose veins and why they cause symptoms.  Patient will begin wearing graduated compression stockings class 1 on a daily basis, beginning first thing in the morning and removing them in the evening. The patient is instructed specifically not to sleep in the stockings.    The patient  will also begin using over-the-counter analgesics such as Motrin 600 mg po TID to help control the symptoms.  In addition, behavioral modification including elevation during the day will be initiated.    Pending the results of these changes the  patient will be reevaluated in three months.   An  ultrasound of the venous system will be obtained.   Further plans will be based on the ultrasound results and whether conservative therapies are successful at eliminating the pain and swelling.   - VAS [place] LOWER EXTREMITY VENOUS REFLUX; Future  2. Chronic venous insufficiency No surgery or intervention at this point in time.    I have had a long discussion with the patient regarding venous insufficiency and why it  causes symptoms. I have discussed with the patient the chronic skin changes that accompany venous insufficiency and the long term sequela such as infection and ulceration.  Patient will begin wearing graduated compression stockings class 1 (20-30 mmHg) or compression wraps on a daily basis a prescription was given. The patient will put the stockings on first thing in the morning and removing them in the evening. The patient is instructed specifically not to sleep in  the stockings.    In addition, behavioral modification including several periods of elevation of the lower extremities during the day will be continued. I have demonstrated that proper elevation is a position with the ankles at heart level.  The patient is instructed to begin routine exercise, especially walking on a daily basis  Patient should undergo duplex ultrasound of the venous system to ensure that DVT or reflux is not present.  Following the review of the ultrasound the patient will follow up in 2-3 months to reassess the degree of swelling and the control that graduated compression stockings or compression wraps  is offering.   The patient can be assessed for a Lymph Pump at that time  - VAS [place] LOWER EXTREMITY VENOUS REFLUX; Future  3. Lymphedema No surgery or intervention at this point in time.    I have had a long discussion with the patient regarding venous insufficiency and why it  causes symptoms. I have discussed with the patient the chronic skin changes that accompany venous insufficiency and the long term sequela such as infection and ulceration.  Patient will begin wearing graduated compression stockings class 1 (20-30 mmHg) or compression wraps on a daily basis a prescription was given. The patient will put the stockings on first thing in the morning and removing them in the evening. The patient is instructed specifically not to sleep in the stockings.    In addition, behavioral modification including several periods of elevation of the lower extremities during the day will be continued. I have demonstrated that proper elevation is a position with the ankles at heart level.  The patient is instructed to begin routine exercise, especially walking on a daily basis  Patient should undergo duplex ultrasound of the venous system to ensure that DVT or reflux is not present.  Following the review of the ultrasound the patient will follow up in 2-3 months to reassess the degree of  swelling and the control that graduated compression stockings or compression wraps  is offering.   The patient can be assessed for a Lymph Pump at that time   4. Benign essential hypertension Continue antihypertensive medications as already ordered, these medications have been reviewed and there are no changes at this time.   5. GERD without esophagitis Continue PPI as already ordered, this medication has been reviewed and there are no changes at this time.  Avoidence of caffeine and alcohol  Moderate elevation of the head of the bed      Hortencia Pilar, MD  08/12/2021 10:41 AM

## 2021-08-23 ENCOUNTER — Encounter: Payer: Self-pay | Admitting: Nurse Practitioner

## 2021-08-23 ENCOUNTER — Ambulatory Visit (INDEPENDENT_AMBULATORY_CARE_PROVIDER_SITE_OTHER): Payer: Medicare Other | Admitting: Nurse Practitioner

## 2021-08-23 ENCOUNTER — Other Ambulatory Visit: Payer: Self-pay

## 2021-08-23 VITALS — BP 132/70 | HR 70 | Temp 98.3°F | Resp 16 | Ht 64.0 in | Wt 166.6 lb

## 2021-08-23 DIAGNOSIS — I10 Essential (primary) hypertension: Secondary | ICD-10-CM | POA: Diagnosis not present

## 2021-08-23 DIAGNOSIS — E782 Mixed hyperlipidemia: Secondary | ICD-10-CM

## 2021-08-23 DIAGNOSIS — K219 Gastro-esophageal reflux disease without esophagitis: Secondary | ICD-10-CM | POA: Diagnosis not present

## 2021-08-23 MED ORDER — EZETIMIBE 10 MG PO TABS: 10.0000 mg | ORAL_TABLET | Freq: Every day | ORAL | 1 refills | Status: DC

## 2021-08-23 NOTE — Progress Notes (Signed)
[facility] ?[address] ?[place], [place] [address] ? ?Internal MEDICINE  ?Office Visit Note ? ?Patient Name: Jordan Carpenter ? [id number]  ?[id number] ? ?Date of Service: 08/23/2021 ? ?Chief Complaint  ?Patient presents with  ? Follow-up  ? Gastroesophageal Reflux  ? Hypertension  ? Hyperlipidemia  ? Results  ?  [place]  ? ? ?HPI ?And presents for a follow-up visit for review of her carotid ultrasound results.  Her carotid ultrasound showed less than 50% stenosis and no significant plaque formation bilaterally.  She has started taking an over-the-counter fish oil supplement to help decrease her cholesterol levels and maintain her heart health.  ? ? ?Current Medication: ?Outpatient Encounter Medications as of 08/23/2021  ?Medication Sig  ? cetirizine (ZYRTEC) 10 MG tablet Take by mouth.  ? Cholecalciferol 25 MCG (1000 UT) capsule Take by mouth.  ? CINNAMON PO Take 600 mg by mouth in the morning and at bedtime.  ? Coenzyme Q10 400 MG CAPS Take by mouth.  ? cyanocobalamin 1000 MCG tablet Take by mouth.  ? Elderberry 500 MG CAPS Take by mouth.  ? ezetimibe (ZETIA) 10 MG tablet Take 1 tablet (10 mg total) by mouth daily.  ? fluticasone (FLONASE) 50 MCG/ACT nasal spray   ? lisinopril-hydrochlorothiazide (ZESTORETIC) 20-25 MG tablet Take 1 tablet by mouth daily.  ? pantoprazole (PROTONIX) 40 MG tablet Take 1 tablet (40 mg total) by mouth 2 (two) times daily.  ? ranitidine (ZANTAC) 300 MG tablet   ? rosuvastatin (CRESTOR) 40 MG tablet Take 1 tablet (40 mg total) by mouth daily.  ? SUMAtriptan (IMITREX) 20 MG/ACT nasal spray Place into the nose.  ? ?No facility-administered encounter medications on file as of 08/23/2021.  ? ? ?Surgical History: ?Past Surgical History:  ?Procedure Laterality Date  ? BACK SURGERY Left   ? 2004  ? BREAST EXCISIONAL BIOPSY Right 1997  ? neg  ? CATARACT EXTRACTION Bilateral   ? eye lift Left   ? KNEE SURGERY Left   ? 2007 AGAIN  ? REPLACEMENT TOTAL KNEE    ? TUBAL LIGATION    ? ? ?Medical  History: ?Past Medical History:  ?Diagnosis Date  ? GERD (gastroesophageal reflux disease)   ? Hyperlipidemia   ? Hypertension   ? ? ?Family History: ?Family History  ?Problem Relation Age of Onset  ? Lung cancer Mother   ? Lymphoma Father   ? Lung cancer Brother   ? Breast cancer Maternal Aunt 60  ? Ovarian cancer Paternal Aunt   ? Kidney cancer Maternal Grandmother   ? Breast cancer Paternal Grandmother   ? ? ?Social History  ? ?Socioeconomic History  ? Marital status: Married  ?  Spouse name: Not on file  ? Number of children: Not on file  ? Years of education: Not on file  ? Highest education level: Not on file  ?Occupational History  ? Not on file  ?Tobacco Use  ? Smoking status: Never  ?  Passive exposure: Never  ? Smokeless tobacco: Never  ?Substance and Sexual Activity  ? Alcohol use: Yes  ?  Alcohol/week: 1.0 standard drink  ?  Types: 1 Glasses of wine per week  ? Drug use: Never  ? Sexual activity: Not on file  ?Other Topics Concern  ? Not on file  ?Social History Narrative  ? Not on file  ? ?Social Determinants of Health  ? ?Financial Resource Strain: Not on file  ?Food Insecurity: Not on file  ?Transportation Needs: Not on file  ?  Physical Activity: Not on file  ?Stress: Not on file  ?Social Connections: Not on file  ?Intimate Partner Violence: Not on file  ? ? ? ? ?Review of Systems  ?Constitutional:  Negative for chills, fatigue and unexpected weight change.  ?HENT:  Negative for congestion, rhinorrhea, sneezing and sore throat.   ?Eyes:  Negative for redness.  ?Respiratory:  Negative for cough, chest tightness and shortness of breath.   ?Cardiovascular:  Negative for chest pain and palpitations.  ?Gastrointestinal:  Negative for abdominal pain, constipation, diarrhea, nausea and vomiting.  ?Genitourinary:  Negative for dysuria and frequency.  ?Musculoskeletal:  Negative for arthralgias, back pain, joint swelling and neck pain.  ?Skin:  Negative for rash.  ?Neurological: Negative.  Negative for tremors  and numbness.  ?Hematological:  Negative for adenopathy. Does not bruise/bleed easily.  ?Psychiatric/Behavioral:  Negative for behavioral problems (Depression), sleep disturbance and suicidal ideas. The patient is not nervous/anxious.   ? ?Vital Signs: ?BP 132/70   Pulse 70   Temp 98.3 ?F (36.8 ?C)   Resp 16   Ht 5\' 4"  (1.626 m)   Wt 166 lb 9.6 oz (75.6 kg)   SpO2 98%   BMI 28.60 kg/m?  ? ? ?Physical Exam ?Vitals reviewed.  ?Constitutional:   ?   General: She is not in acute distress. ?   Appearance: Normal appearance. She is not ill-appearing.  ?HENT:  ?   Head: Normocephalic and atraumatic.  ?Eyes:  ?   Pupils: Pupils are equal, round, and reactive to light.  ?Cardiovascular:  ?   Rate and Rhythm: Normal rate and regular rhythm.  ?Pulmonary:  ?   Effort: Pulmonary effort is normal. No respiratory distress.  ?Neurological:  ?   Mental Status: She is alert and oriented to person, place, and time.  ?Psychiatric:     ?   Mood and Affect: Mood normal.     ?   Behavior: Behavior normal.  ? ? ? ? ? ?Assessment/Plan: ?1. Primary hypertension ?Stable with current medications ? ?2. Mixed hyperlipidemia ?No significant carotid stenosis.  Last lipid panel was elevated.  Patient wanted to discuss Repatha but levels are not that elevated.  Patient is taking fish oil supplement and will add ezetimibe 10 mg daily to facilitate decreased in her cholesterol levels.  We will consider rechecking her lipid panel when she comes to her follow-up office visit in July. ?- ezetimibe (ZETIA) 10 MG tablet; Take 1 tablet (10 mg total) by mouth daily.  Dispense: 90 tablet; Refill: 1 ? ?3. Gastroesophageal reflux disease without esophagitis ?Acid reflux symptoms well controlled with current medication ? ? ?General Counseling: August understanding of the findings of todays visit and agrees with plan of treatment. I have discussed any further diagnostic evaluation that may be needed or ordered today. We also reviewed her  medications today. she has been encouraged to call the office with any questions or concerns that should arise related to todays visit. ? ? ? ?No orders of the defined types were placed in this encounter. ? ? ?Meds ordered this encounter  ?Medications  ? ezetimibe (ZETIA) 10 MG tablet  ?  Sig: Take 1 tablet (10 mg total) by mouth daily.  ?  Dispense:  90 tablet  ?  Refill:  1  ? ? ?Return in about 4 months (around 12/23/2021) for F/U, med refill, Quindon Denker PCP. ? ? ?Total time spent:30 Minutes ?Time spent includes review of chart, medications, test results, and follow up plan with the patient.  ? ?   Controlled Substance Database was reviewed by me. ? ?This patient was seen by Sallyanne Kuster, FNP-C in collaboration with Dr. Beverely Risen as a part of collaborative care agreement. ? ? ?Jabez Molner R. Tedd Sias, MSN, FNP-C ?Internal medicine  ?

## 2021-08-29 NOTE — Procedures (Signed)
[facility] [address] [place], [place] [address]  DATE OF SERVICE: August 11, 2021  CAROTID DOPPLER INTERPRETATION:  Bilateral Carotid Ultrsasound and Color Doppler Examination was performed. The RIGHT CCA shows no significant plaque in the vessel. The LEFT CCA shows no significant plaque in the vessel. There was no intimal thickening noted in the RIGHT carotid artery. There was no intimal thickening in the LEFT carotid artery.  The RIGHT CCA shows peak systolic velocity of 79 cm per second. The end diastolic velocity is 13 cm per second on the RIGHT side. The RIGHT ICA shows peak systolic velocity of 83 per second. RIGHT sided ICA end diastolic velocity is 18 cm per second. The RIGHT ECA shows a peak systolic velocity of 170 cm per second. The ICA/CCA ratio is calculated to be 1.1. This suggests less than 50% stenosis. The Vertebral Artery shows antegrade flow.  The LEFT CCA shows peak systolic velocity of 67 cm per second. The end diastolic velocity is 17 cm per second on the LEFT side. The LEFT ICA shows peak systolic velocity of 76 per second. LEFT sided ICA end diastolic velocity is 16 cm per second. The LEFT ECA shows a peak systolic velocity of 123 cm per second. The ICA/CCA ratio is calculated to be 1.1. This suggests less than 50% stenosis. The Vertebral Artery shows antegrade flow.   Impression:    The RIGHT CAROTID shows less than 50% stenosis. The LEFT CAROTID shows less than 50% stenosis.  There is no significant plaque formation noted on the LEFT and no significant plaque on the RIGHT  side. Consider a repeat Carotid doppler if clinical situation and symptoms warrant in 6-12 months. Patient should be encouraged to change lifestyles such as smoking cessation, regular exercise and dietary modification. Use of statins in the right clinical setting and ASA is encouraged.  Yevonne Pax, MD [facility] Critical [facility] Medicine

## 2021-09-01 ENCOUNTER — Other Ambulatory Visit: Payer: Self-pay | Admitting: Internal Medicine

## 2021-09-01 DIAGNOSIS — Z1231 Encounter for screening mammogram for malignant neoplasm of breast: Secondary | ICD-10-CM

## 2021-09-22 ENCOUNTER — Encounter: Payer: Self-pay | Admitting: Nurse Practitioner

## 2021-09-29 ENCOUNTER — Telehealth: Payer: Self-pay

## 2021-09-29 NOTE — Telephone Encounter (Signed)
-----   Message from Sallyanne Kuster, NP sent at 09/29/2021  8:21 AM EDT ----- ?Bilateral carotid ultrasound shows less than 50% stenosis bilaterally and no significant plaque formation bilaterally.  This is a normal carotid ultrasound.  Follow-up ultrasound annually to monitor for changes ?

## 2021-09-29 NOTE — Telephone Encounter (Signed)
Spoke to pt, provided results and let her know to get U/S annually.  ?

## 2021-10-12 ENCOUNTER — Ambulatory Visit
Admission: RE | Admit: 2021-10-12 | Discharge: 2021-10-12 | Disposition: A | Discharge status: Home / Self Care | Payer: Medicare Other | Source: Ambulatory Visit | Attending: Internal Medicine | Admitting: Internal Medicine

## 2021-10-12 DIAGNOSIS — Z1231 Encounter for screening mammogram for malignant neoplasm of breast: Secondary | ICD-10-CM | POA: Diagnosis present

## 2021-11-10 ENCOUNTER — Other Ambulatory Visit (INDEPENDENT_AMBULATORY_CARE_PROVIDER_SITE_OTHER): Payer: Self-pay | Admitting: Vascular Surgery

## 2021-11-10 DIAGNOSIS — I83899 Varicose veins of unspecified lower extremities with other complications: Secondary | ICD-10-CM

## 2021-11-10 NOTE — Progress Notes (Signed)
MRN : [id number]  Jordan Carpenter is a 71 y.o. ([birth year]) female who presents with chief complaint of varicose veins hurt.  History of Present Illness:   The patient returns to the office for followup evaluation regarding leg swelling.  The swelling has improved quite a bit and the pain associated with swelling has decreased substantially. There have not been any interval development of a ulcerations or wounds.  Since the previous visit the patient has been wearing graduated compression stockings and has noted some improvement in the lymphedema. The patient has been using compression routinely morning until night.  The patient also states elevation during the day and exercise (such as walking) is being done too.   Venous ultrasound shows normal deep venous system, no evidence of acute or chronic DVT.  Superficial reflux is present in the right great saphenous but not in the left GSV   No outpatient medications have been marked as taking for the 11/11/21 encounter (Appointment) with Gilda CreaseSchnier, Latina CraverGregory G, MD.    Past Medical History:  Diagnosis Date   GERD (gastroesophageal reflux disease)    Hyperlipidemia    Hypertension     Past Surgical History:  Procedure Laterality Date   BACK SURGERY Left    2004   BREAST EXCISIONAL BIOPSY Right 1997   neg   CATARACT EXTRACTION Bilateral    eye lift Left    KNEE SURGERY Left    2007 AGAIN   REPLACEMENT TOTAL KNEE     TUBAL LIGATION      Social History Social History   Tobacco Use   Smoking status: Never    Passive exposure: Never   Smokeless tobacco: Never  Substance Use Topics   Alcohol use: Yes    Alcohol/week: 1.0 standard drink    Types: 1 Glasses of wine per week   Drug use: Never    Family History Family History  Problem Relation Age of Onset   Lung cancer Mother    Lymphoma Father    Lung cancer Brother    Breast cancer Maternal Aunt [id number]   Ovarian cancer Paternal Aunt    Kidney cancer Maternal Grandmother     Breast cancer Paternal Grandmother     Allergies  Allergen Reactions   Sulfa Antibiotics Rash   Sulfamethoxazole-Trimethoprim Rash     REVIEW OF SYSTEMS (Negative unless checked)  Constitutional: [] Weight loss  [] Fever  [] Chills Cardiac: [] Chest pain   [] Chest pressure   [] Palpitations   [] Shortness of breath when laying flat   [] Shortness of breath with exertion. Vascular:  [] Pain in legs with walking   [x] Pain in legs with standing  [] History of DVT   [] Phlebitis   [] Swelling in legs   [x] Varicose veins   [] Non-healing ulcers Pulmonary:   [] Uses home oxygen   [] Productive cough   [] Hemoptysis   [] Wheeze  [] COPD   [] Asthma Neurologic:  [] Dizziness   [] Seizures   [] History of stroke   [] History of TIA  [] Aphasia   [] Vissual changes   [] Weakness or numbness in arm   [] Weakness or numbness in leg Musculoskeletal:   [] Joint swelling   [] Joint pain   [] Low back pain Hematologic:  [] Easy bruising  [] Easy bleeding   [] Hypercoagulable state   [] Anemic Gastrointestinal:  [] Diarrhea   [] Vomiting  [x] Gastroesophageal reflux/heartburn   [] Difficulty swallowing. Genitourinary:  [] Chronic kidney disease   [] Difficult urination  [] Frequent urination   [] Blood in urine Skin:  [] Rashes   [] Ulcers  Psychological:  [] History of anxiety   []   History of major depression.  Physical Examination  There were no vitals filed for this visit. There is no height or weight on file to calculate BMI. Gen: WD/WN, NAD Head: [place]/AT, No temporalis wasting.  Ear/Nose/Throat: Hearing grossly intact, nares w/o erythema or drainage, pinna without lesions Eyes: PER, EOMI, sclera nonicteric.  Neck: Supple, no gross masses.  No JVD.  Pulmonary:  Good air movement, no audible wheezing, no use of accessory muscles.  Cardiac: RRR, precordium not hyperdynamic. Vascular:  Scattered varicosities with mild venous stasis changes to the legs bilaterally.  Trace soft pitting edema  Vessel Right Left  Radial Palpable Palpable   Gastrointestinal: soft, non-distended. No guarding/no peritoneal signs.  Musculoskeletal: M/S 5/5 throughout.  No deformity.  Neurologic: CN 2-12 intact. Pain and light touch intact in extremities.  Symmetrical.  Speech is fluent. Motor exam as listed above. Psychiatric: Judgment intact, Mood & affect appropriate for pt's clinical situation. Dermatologic: Venous rashes no ulcers noted.  No changes consistent with cellulitis. Lymph : No lichenification or skin changes of chronic lymphedema.  CBC Lab Results  Component Value Date   WBC 6.5 03/29/2021   HGB 14.0 03/29/2021   HCT 42.6 03/29/2021   MCV 85 03/29/2021   PLT 241 03/29/2021    BMET    Component Value Date/Time   NA 136 03/29/2021 0846   K 3.8 03/29/2021 0846   CL 97 03/29/2021 0846   CO2 26 03/29/2021 0846   GLUCOSE 82 03/29/2021 0846   BUN 18 03/29/2021 0846   CREATININE 0.68 03/29/2021 0846   CALCIUM 9.7 03/29/2021 0846   CrCl cannot be calculated (Patient's most recent lab result is older than the maximum 21 days allowed.).  COAG No results found for: INR, PROTIME  Radiology MM 3D SCREEN BREAST BILATERAL  Result Date: 10/13/2021 CLINICAL DATA:  Screening. EXAM: DIGITAL SCREENING BILATERAL MAMMOGRAM WITH TOMOSYNTHESIS AND CAD TECHNIQUE: Bilateral screening digital craniocaudal and mediolateral oblique mammograms were obtained. Bilateral screening digital breast tomosynthesis was performed. The images were evaluated with computer-aided detection. COMPARISON:  Previous exam(s). ACR Breast Density Category b: There are scattered areas of fibroglandular density. FINDINGS: There are no findings suspicious for malignancy. IMPRESSION: No mammographic evidence of malignancy. A result letter of this screening mammogram will be mailed directly to the patient. RECOMMENDATION: Screening mammogram in one year. (Code:SM-B-01Y) BI-RADS CATEGORY  1: Negative. Electronically Signed   By: Edwin Cap M.D.   On: 10/13/2021 10:42      Assessment/Plan 1. Varicose veins of both lower extremities with pain Recommend:  No surgery or intervention at this point in time.  We did discuss possible laser ablation but have elected not to proceed at this time.  I have reviewed my discussion with the patient regarding venous insufficiency and why it causes symptoms. I have discussed with the patient the chronic skin changes that accompany venous insufficiency and the long term sequela such as ulceration. Patient will contnue wearing graduated compression stockings on a daily basis, as this has provided excellent control of his edema. The patient will put the stockings on first thing in the morning and removing them in the evening. The patient is reminded not to sleep in the stockings.  In addition, behavioral modification including elevation during the day will be initiated. Exercise is strongly encouraged.  Previous duplex ultrasound of the lower extremities shows normal deep system, no significant superficial reflux was identified.  Given the patient's good control and lack of any problems regarding the venous insufficiency and lymphedema a  lymph pump in not need at this time.    The patient will follow up with me PRN should anything change.  The patient voices agreement with this plan.    A total of 35 minutes was spent with this patient and greater than 50% was spent in counseling and coordination of care with the patient.  Discussion included the treatment options for vascular disease including indications for surgery and intervention.  Also discussed is the appropriate timing of treatment.  In addition medical therapy was discussed.   2. Chronic venous insufficiency Recommend:  No surgery or intervention at this point in time.  I have reviewed my discussion with the patient regarding venous insufficiency and why it causes symptoms. I have discussed with the patient the chronic skin changes that accompany venous  insufficiency and the long term sequela such as ulceration. Patient will contnue wearing graduated compression stockings on a daily basis, as this has provided excellent control of his edema. The patient will put the stockings on first thing in the morning and removing them in the evening. The patient is reminded not to sleep in the stockings.  In addition, behavioral modification including elevation during the day will be initiated. Exercise is strongly encouraged.  Previous duplex ultrasound of the lower extremities shows normal deep system, no significant superficial reflux was identified.  Given the patient's good control and lack of any problems regarding the venous insufficiency and lymphedema a lymph pump in not need at this time.    The patient will follow up with me PRN should anything change.  The patient voices agreement with this plan.   3. Primary hypertension Continue antihypertensive medications as already ordered, these medications have been reviewed and there are no changes at this time.   4. GERD without esophagitis Continue PPI as already ordered, this medication has been reviewed and there are no changes at this time.  Avoidence of caffeine and alcohol  Moderate elevation of the head of the bed    5. Pure hypercholesterolemia Continue statin as ordered and reviewed, no changes at this time   6. Varicose veins with swelling See #1 - VAS [place] LOWER EXTREMITY VENOUS REFLUX    Levora Dredge, MD  11/10/2021 8:56 PM

## 2021-11-11 ENCOUNTER — Encounter (INDEPENDENT_AMBULATORY_CARE_PROVIDER_SITE_OTHER): Payer: Self-pay | Admitting: Vascular Surgery

## 2021-11-11 ENCOUNTER — Ambulatory Visit (INDEPENDENT_AMBULATORY_CARE_PROVIDER_SITE_OTHER): Payer: Medicare Other

## 2021-11-11 ENCOUNTER — Ambulatory Visit (INDEPENDENT_AMBULATORY_CARE_PROVIDER_SITE_OTHER): Payer: Medicare Other | Admitting: Vascular Surgery

## 2021-11-11 VITALS — BP 122/74 | HR 64 | Resp 16 | Ht 64.0 in | Wt 169.0 lb

## 2021-11-11 DIAGNOSIS — I83899 Varicose veins of unspecified lower extremities with other complications: Secondary | ICD-10-CM

## 2021-11-11 DIAGNOSIS — K219 Gastro-esophageal reflux disease without esophagitis: Secondary | ICD-10-CM | POA: Diagnosis not present

## 2021-11-11 DIAGNOSIS — I83813 Varicose veins of bilateral lower extremities with pain: Secondary | ICD-10-CM

## 2021-11-11 DIAGNOSIS — E7800 Pure hypercholesterolemia, unspecified: Secondary | ICD-10-CM

## 2021-11-11 DIAGNOSIS — I10 Essential (primary) hypertension: Secondary | ICD-10-CM

## 2021-11-11 DIAGNOSIS — I872 Venous insufficiency (chronic) (peripheral): Secondary | ICD-10-CM

## 2021-11-13 ENCOUNTER — Encounter (INDEPENDENT_AMBULATORY_CARE_PROVIDER_SITE_OTHER): Payer: Self-pay | Admitting: Vascular Surgery

## 2021-12-20 ENCOUNTER — Encounter: Payer: Self-pay | Admitting: Nurse Practitioner

## 2021-12-20 ENCOUNTER — Ambulatory Visit (INDEPENDENT_AMBULATORY_CARE_PROVIDER_SITE_OTHER): Payer: Medicare Other | Admitting: Nurse Practitioner

## 2021-12-20 VITALS — BP 132/72 | HR 63 | Temp 97.7°F | Resp 16 | Ht 64.0 in | Wt 170.2 lb

## 2021-12-20 DIAGNOSIS — I10 Essential (primary) hypertension: Secondary | ICD-10-CM

## 2021-12-20 DIAGNOSIS — E663 Overweight: Secondary | ICD-10-CM

## 2021-12-20 DIAGNOSIS — Z6829 Body mass index (BMI) 29.0-29.9, adult: Secondary | ICD-10-CM

## 2021-12-20 DIAGNOSIS — M159 Polyosteoarthritis, unspecified: Secondary | ICD-10-CM | POA: Diagnosis not present

## 2021-12-20 DIAGNOSIS — K219 Gastro-esophageal reflux disease without esophagitis: Secondary | ICD-10-CM

## 2021-12-20 DIAGNOSIS — E782 Mixed hyperlipidemia: Secondary | ICD-10-CM | POA: Diagnosis not present

## 2021-12-20 MED ORDER — ROSUVASTATIN CALCIUM 40 MG PO TABS: 40.0000 mg | ORAL_TABLET | Freq: Every day | ORAL | 1 refills | Status: DC

## 2021-12-20 MED ORDER — SEMAGLUTIDE-WEIGHT MANAGEMENT 0.25 MG/0.5ML ~~LOC~~ SOAJ: 0.2500 mg | SUBCUTANEOUS | 0 refills | Status: DC

## 2021-12-20 MED ORDER — SEMAGLUTIDE-WEIGHT MANAGEMENT 0.5 MG/0.5ML ~~LOC~~ SOAJ: 0.5000 mg | SUBCUTANEOUS | 2 refills | Status: DC

## 2021-12-20 MED ORDER — EZETIMIBE 10 MG PO TABS: 10.0000 mg | ORAL_TABLET | Freq: Every day | ORAL | 1 refills | Status: DC

## 2021-12-20 MED ORDER — LISINOPRIL-HYDROCHLOROTHIAZIDE 20-25 MG PO TABS
1.0000 | ORAL_TABLET | Freq: Every day | ORAL | 1 refills | Status: DC
Start: 2021-12-20 — End: 2022-06-28

## 2021-12-20 MED ORDER — PANTOPRAZOLE SODIUM 40 MG PO TBEC: 40.0000 mg | DELAYED_RELEASE_TABLET | Freq: Two times a day (BID) | ORAL | 1 refills | Status: DC

## 2021-12-20 MED ORDER — CELECOXIB 200 MG PO CAPS: 200.0000 mg | ORAL_CAPSULE | Freq: Two times a day (BID) | ORAL | 1 refills | Status: DC

## 2021-12-20 NOTE — Progress Notes (Signed)
[facility] [address] [place], [place] [address]  Internal MEDICINE  Office Visit Note  Patient Name: Jordan Carpenter  [id number]  [id number]  Date of Service: 12/20/2021  Chief Complaint  Patient presents with   Follow-up   Gastroesophageal Reflux   Hypertension   Hyperlipidemia   Medication Refill    HPI Jordan Carpenter presents for follow-up visit for hypertension, acid reflux hyperlipidemia, and medication refills.  Her blood pressure and other vital signs are stable and within normal limits.  She had her routine mammogram done and the results were BI-RADS category 1 negative.  She is also interested in trying medication for weight loss.  After discussing medications, she is interested in trying [facility].     Current Medication: Outpatient Encounter Medications as of 12/20/2021  Medication Sig   calcium carbonate (OS-CAL) 1250 (500 Ca) MG chewable tablet Chew 1 tablet by mouth daily.   celecoxib (CELEBREX) 200 MG capsule Take 1 capsule (200 mg total) by mouth 2 (two) times daily.   cetirizine (ZYRTEC) 10 MG tablet Take by mouth.   Cholecalciferol 25 MCG (1000 UT) capsule Take by mouth.   CINNAMON PO Take 600 mg by mouth in the morning and at bedtime.   Coenzyme Q10 400 MG CAPS Take by mouth.   Elderberry 500 MG CAPS Take by mouth.   fluticasone (FLONASE) 50 MCG/ACT nasal spray    Omega-3 Fatty Acids (FISH OIL CONCENTRATE) 1000 MG CAPS    ranitidine (ZANTAC) 300 MG tablet    Semaglutide-Weight Management 0.25 MG/0.5ML SOAJ Inject 0.25 mg into the skin once a week.   Semaglutide-Weight Management 0.5 MG/0.5ML SOAJ Inject 0.5 mg into the skin once a week.   SUMAtriptan (IMITREX) 20 MG/ACT nasal spray Place into the nose.   [DISCONTINUED] ezetimibe (ZETIA) 10 MG tablet Take 1 tablet (10 mg total) by mouth daily.   [DISCONTINUED] lisinopril-hydrochlorothiazide (ZESTORETIC) 20-25 MG tablet Take 1 tablet by mouth daily.   [DISCONTINUED] pantoprazole (PROTONIX) 40 MG tablet Take 1  tablet (40 mg total) by mouth 2 (two) times daily.   [DISCONTINUED] rosuvastatin (CRESTOR) 40 MG tablet Take 1 tablet (40 mg total) by mouth daily.   ezetimibe (ZETIA) 10 MG tablet Take 1 tablet (10 mg total) by mouth daily.   lisinopril-hydrochlorothiazide (ZESTORETIC) 20-25 MG tablet Take 1 tablet by mouth daily.   pantoprazole (PROTONIX) 40 MG tablet Take 1 tablet (40 mg total) by mouth 2 (two) times daily.   rosuvastatin (CRESTOR) 40 MG tablet Take 1 tablet (40 mg total) by mouth daily.   [DISCONTINUED] cyanocobalamin 1000 MCG tablet Take by mouth. (Patient not taking: Reported on 11/11/2021)   No facility-administered encounter medications on file as of 12/20/2021.    Surgical History: Past Surgical History:  Procedure Laterality Date   BACK SURGERY Left    2004   BREAST EXCISIONAL BIOPSY Right 1997   neg   CATARACT EXTRACTION Bilateral    eye lift Left    KNEE SURGERY Left    2007 AGAIN   REPLACEMENT TOTAL KNEE     TUBAL LIGATION      Medical History: Past Medical History:  Diagnosis Date   GERD (gastroesophageal reflux disease)    Hyperlipidemia    Hypertension     Family History: Family History  Problem Relation Age of Onset   Lung cancer Mother    Lymphoma Father    Lung cancer Brother    Breast cancer Maternal Aunt 74   Ovarian cancer Paternal Aunt    Kidney  cancer Maternal Grandmother    Breast cancer Paternal Grandmother     Social History   Socioeconomic History   Marital status: Married    Spouse name: Not on file   Number of children: Not on file   Years of education: Not on file   Highest education level: Not on file  Occupational History   Not on file  Tobacco Use   Smoking status: Never    Passive exposure: Never   Smokeless tobacco: Never  Substance and Sexual Activity   Alcohol use: Yes    Alcohol/week: 1.0 standard drink of alcohol    Types: 1 Glasses of wine per week   Drug use: Never   Sexual activity: Not on file  Other Topics  Concern   Not on file  Social History Narrative   Not on file   Social Determinants of Health   Financial Resource Strain: Not on file  Food Insecurity: Not on file  Transportation Needs: Not on file  Physical Activity: Not on file  Stress: Not on file  Social Connections: Not on file  Intimate Partner Violence: Not on file      Review of Systems  Constitutional:  Positive for unexpected weight change (cannot lose weight despite decreasing calorie intake/carbs and walking several miles per week). Negative for chills and fatigue.  HENT:  Negative for congestion, rhinorrhea, sneezing and sore throat.   Eyes:  Negative for redness.  Respiratory: Negative.  Negative for cough, chest tightness, shortness of breath and wheezing.   Cardiovascular: Negative.  Negative for chest pain and palpitations.  Gastrointestinal:  Negative for abdominal pain, constipation, diarrhea, nausea and vomiting.  Genitourinary:  Negative for dysuria and frequency.  Musculoskeletal:  Negative for arthralgias, back pain, joint swelling and neck pain.  Skin:  Negative for rash.  Neurological: Negative.  Negative for tremors and numbness.  Hematological:  Negative for adenopathy. Does not bruise/bleed easily.  Psychiatric/Behavioral:  Negative for behavioral problems (Depression), sleep disturbance and suicidal ideas. The patient is not nervous/anxious.     Vital Signs: BP 132/72   Pulse 63   Temp 97.7 F (36.5 C)   Resp 16   Ht 5\' 4"  (1.626 m)   Wt 170 lb 3.2 oz (77.2 kg)   SpO2 99%   BMI 29.21 kg/m    Physical Exam Vitals reviewed.  Constitutional:      General: She is not in acute distress.    Appearance: Normal appearance. She is not ill-appearing.  HENT:     Head: Normocephalic and atraumatic.  Eyes:     Pupils: Pupils are equal, round, and reactive to light.  Cardiovascular:     Rate and Rhythm: Normal rate and regular rhythm.  Pulmonary:     Effort: Pulmonary effort is normal. No  respiratory distress.  Neurological:     Mental Status: She is alert and oriented to person, place, and time.  Psychiatric:        Mood and Affect: Mood normal.        Behavior: Behavior normal.        Assessment/Plan: 1. Primary hypertension Blood pressure stable on current medications, refills ordered. - lisinopril-hydrochlorothiazide (ZESTORETIC) 20-25 MG tablet; Take 1 tablet by mouth daily.  Dispense: 90 tablet; Refill: 1  2. Mixed hyperlipidemia Continue rosuvastatin and ezetimibe as prescribed, refills ordered. - rosuvastatin (CRESTOR) 40 MG tablet; Take 1 tablet (40 mg total) by mouth daily.  Dispense: 90 tablet; Refill: 1 - ezetimibe (ZETIA) 10 MG tablet; Take 1  tablet (10 mg total) by mouth daily.  Dispense: 90 tablet; Refill: 1  3. Gastroesophageal reflux disease without esophagitis Acid reflux well controlled with pantoprazole, refills ordered. - pantoprazole (PROTONIX) 40 MG tablet; Take 1 tablet (40 mg total) by mouth 2 (two) times daily.  Dispense: 90 tablet; Refill: 1  4. Primary osteoarthritis involving multiple joints Patient takes Celebrex 2 times daily as needed for arthritis, refills ordered - celecoxib (CELEBREX) 200 MG capsule; Take 1 capsule (200 mg total) by mouth 2 (two) times daily.  Dispense: 180 capsule; Refill: 1  5. Overweight with body mass index (BMI) of 29 to 29.9 in adult Patient will try Wegovy, 4 weeks of the 0.25 mg dose prescribed for 1 month and the next dose up at 0.5 mg weekly also prescribed.  Discussed follow-up with patient and agreed to follow-up in 2 months.  And what to expect when taking the medication was discussed with the patient.  Mentioned also checking the manufacturer's website co-pay assistance - Semaglutide-Weight Management 0.25 MG/0.5ML SOAJ; Inject 0.25 mg into the skin once a week.  Dispense: 2 mL; Refill: 0 - Semaglutide-Weight Management 0.5 MG/0.5ML SOAJ; Inject 0.5 mg into the skin once a week.  Dispense: 2 mL; Refill:  2   General Counseling: Jordan Carpenter verbalizes understanding of the findings of todays visit and agrees with plan of treatment. I have discussed any further diagnostic evaluation that may be needed or ordered today. We also reviewed her medications today. she has been encouraged to call the office with any questions or concerns that should arise related to todays visit.    No orders of the defined types were placed in this encounter.   Meds ordered this encounter  Medications   lisinopril-hydrochlorothiazide (ZESTORETIC) 20-25 MG tablet    Sig: Take 1 tablet by mouth daily.    Dispense:  90 tablet    Refill:  1   celecoxib (CELEBREX) 200 MG capsule    Sig: Take 1 capsule (200 mg total) by mouth 2 (two) times daily.    Dispense:  180 capsule    Refill:  1   rosuvastatin (CRESTOR) 40 MG tablet    Sig: Take 1 tablet (40 mg total) by mouth daily.    Dispense:  90 tablet    Refill:  1   pantoprazole (PROTONIX) 40 MG tablet    Sig: Take 1 tablet (40 mg total) by mouth 2 (two) times daily.    Dispense:  90 tablet    Refill:  1   ezetimibe (ZETIA) 10 MG tablet    Sig: Take 1 tablet (10 mg total) by mouth daily.    Dispense:  90 tablet    Refill:  1   Semaglutide-Weight Management 0.25 MG/0.5ML SOAJ    Sig: Inject 0.25 mg into the skin once a week.    Dispense:  2 mL    Refill:  0    Please fill asap, pt may need prior approval please let our office know so we can complete it.   Semaglutide-Weight Management 0.5 MG/0.5ML SOAJ    Sig: Inject 0.5 mg into the skin once a week.    Dispense:  2 mL    Refill:  2    Patient will start on this increased dose after the first 4 weeks of the 0.25 dose.    Return in about 2 months (around 02/20/2022) for F/U, Weight loss, Jordan Carpenter PCP.   Total time spent:30 Minutes Time spent includes review of chart, medications, test results, and  follow up plan with the patient.   [place] Controlled Substance Database was reviewed by me.  This patient was seen by  Sallyanne Kuster, FNP-C in collaboration with Dr. Beverely Risen as a part of collaborative care agreement.   Jordan Carpenter R. Tedd Sias, MSN, FNP-C Internal medicine

## 2021-12-28 ENCOUNTER — Telehealth: Payer: Self-pay

## 2021-12-28 ENCOUNTER — Other Ambulatory Visit: Payer: Self-pay

## 2021-12-28 DIAGNOSIS — Z6829 Body mass index (BMI) 29.0-29.9, adult: Secondary | ICD-10-CM

## 2021-12-28 MED ORDER — SEMAGLUTIDE-WEIGHT MANAGEMENT 0.5 MG/0.5ML ~~LOC~~ SOAJ: 0.5000 mg | SUBCUTANEOUS | 2 refills | Status: DC

## 2021-12-28 MED ORDER — SEMAGLUTIDE-WEIGHT MANAGEMENT 0.25 MG/0.5ML ~~LOC~~ SOAJ: 0.2500 mg | SUBCUTANEOUS | 0 refills | Status: DC

## 2021-12-28 NOTE — Telephone Encounter (Signed)
Pt called this morning to report that [facility] does not have W[place]ovy for her and would like to see if prescription can be sent to [facility] instead. Will relay message to both Desha and Alyssa, 12/28/2021.

## 2022-01-02 ENCOUNTER — Telehealth: Payer: Self-pay

## 2022-01-02 NOTE — Telephone Encounter (Signed)
PA for [facility] sent 01/02/22 @ 542 pm

## 2022-01-14 ENCOUNTER — Encounter: Payer: Self-pay | Admitting: Nurse Practitioner

## 2022-01-21 ENCOUNTER — Encounter: Payer: Self-pay | Admitting: Nurse Practitioner

## 2022-01-28 ENCOUNTER — Ambulatory Visit (INDEPENDENT_AMBULATORY_CARE_PROVIDER_SITE_OTHER): Payer: Medicare Other | Admitting: Nurse Practitioner

## 2022-01-28 ENCOUNTER — Encounter: Payer: Self-pay | Admitting: Nurse Practitioner

## 2022-01-28 VITALS — BP 121/65 | HR 62 | Temp 97.8°F | Resp 16 | Ht 60.0 in | Wt 170.0 lb

## 2022-01-28 DIAGNOSIS — Z6829 Body mass index (BMI) 29.0-29.9, adult: Secondary | ICD-10-CM

## 2022-01-28 DIAGNOSIS — I10 Essential (primary) hypertension: Secondary | ICD-10-CM | POA: Diagnosis not present

## 2022-01-28 DIAGNOSIS — E663 Overweight: Secondary | ICD-10-CM | POA: Diagnosis not present

## 2022-01-28 DIAGNOSIS — E782 Mixed hyperlipidemia: Secondary | ICD-10-CM | POA: Diagnosis not present

## 2022-01-28 MED ORDER — QSYMIA 3.75-23 MG PO CP24: 1.0000 | ORAL_CAPSULE | Freq: Every day | ORAL | 0 refills | Status: DC

## 2022-01-28 NOTE — Progress Notes (Signed)
[facility] [address] [place], [place] [address]  Internal MEDICINE  Office Visit Note  Patient Name: Jordan Carpenter  [id number]  [id number]  Date of Service: 01/28/2022  Chief Complaint  Patient presents with   Follow-up    Wegovy denied    Medical Management of Chronic Issues    Weight management     HPI Jordan Carpenter presents for a follow-up visit for weight loss management, hypertension, and dermatitis to right lower leg. Weight loss management --wegovy PA was denied and the medication is also on backorder.  Patient wants to try something else if possible Hypertension --blood pressure is controlled on current medication Dermatitis right lower leg --waiting to be seen by dermatology    Current Medication: Outpatient Encounter Medications as of 01/28/2022  Medication Sig   Phentermine-Topiramate (QSYMIA) 3.75-23 MG CP24 Take 1 capsule by mouth daily before breakfast.   calcium carbonate (OS-CAL) 1250 (500 Ca) MG chewable tablet Chew 1 tablet by mouth daily.   celecoxib (CELEBREX) 200 MG capsule Take 1 capsule (200 mg total) by mouth 2 (two) times daily.   cetirizine (ZYRTEC) 10 MG tablet Take by mouth.   Cholecalciferol 25 MCG (1000 UT) capsule Take by mouth.   CINNAMON PO Take 600 mg by mouth in the morning and at bedtime.   Coenzyme Q10 400 MG CAPS Take by mouth.   Elderberry 500 MG CAPS Take by mouth.   ezetimibe (ZETIA) 10 MG tablet Take 1 tablet (10 mg total) by mouth daily.   fluticasone (FLONASE) 50 MCG/ACT nasal spray    lisinopril-hydrochlorothiazide (ZESTORETIC) 20-25 MG tablet Take 1 tablet by mouth daily.   Omega-3 Fatty Acids (FISH OIL CONCENTRATE) 1000 MG CAPS    pantoprazole (PROTONIX) 40 MG tablet Take 1 tablet (40 mg total) by mouth 2 (two) times daily.   ranitidine (ZANTAC) 300 MG tablet    rosuvastatin (CRESTOR) 40 MG tablet Take 1 tablet (40 mg total) by mouth daily.   SUMAtriptan (IMITREX) 20 MG/ACT nasal spray Place into the nose.   [DISCONTINUED]  Semaglutide-Weight Management 0.25 MG/0.5ML SOAJ Inject 0.25 mg into the skin once a week.   [DISCONTINUED] Semaglutide-Weight Management 0.5 MG/0.5ML SOAJ Inject 0.5 mg into the skin once a week.   No facility-administered encounter medications on file as of 01/28/2022.    Surgical History: Past Surgical History:  Procedure Laterality Date   BACK SURGERY Left    2004   BREAST EXCISIONAL BIOPSY Right 1997   neg   CATARACT EXTRACTION Bilateral    eye lift Left    KNEE SURGERY Left    2007 AGAIN   REPLACEMENT TOTAL KNEE     TUBAL LIGATION      Medical History: Past Medical History:  Diagnosis Date   GERD (gastroesophageal reflux disease)    Hyperlipidemia    Hypertension     Family History: Family History  Problem Relation Age of Onset   Lung cancer Mother    Lymphoma Father    Lung cancer Brother    Breast cancer Maternal Aunt 60   Ovarian cancer Paternal Aunt    Kidney cancer Maternal Grandmother    Breast cancer Paternal Grandmother     Social History   Socioeconomic History   Marital status: Married    Spouse name: Not on file   Number of children: Not on file   Years of education: Not on file   Highest education level: Not on file  Occupational History   Not on file  Tobacco Use  Smoking status: Never    Passive exposure: Never   Smokeless tobacco: Never  Substance and Sexual Activity   Alcohol use: Yes    Alcohol/week: 1.0 standard drink of alcohol    Types: 1 Glasses of wine per week   Drug use: Never   Sexual activity: Not on file  Other Topics Concern   Not on file  Social History Narrative   Not on file   Social Determinants of Health   Financial Resource Strain: Not on file  Food Insecurity: Not on file  Transportation Needs: Not on file  Physical Activity: Not on file  Stress: Not on file  Social Connections: Not on file  Intimate Partner Violence: Not on file      Review of Systems  Constitutional:  Positive for unexpected  weight change (cannot lose weight despite decreasing calorie intake/carbs and walking several miles per week). Negative for chills and fatigue.  HENT:  Negative for congestion, rhinorrhea, sneezing and sore throat.   Eyes:  Negative for redness.  Respiratory: Negative.  Negative for cough, chest tightness, shortness of breath and wheezing.   Cardiovascular: Negative.  Negative for chest pain and palpitations.  Gastrointestinal:  Negative for abdominal pain, constipation, diarrhea, nausea and vomiting.  Genitourinary:  Negative for dysuria and frequency.  Musculoskeletal:  Negative for arthralgias, back pain, joint swelling and neck pain.  Skin:  Negative for rash.  Neurological: Negative.  Negative for tremors and numbness.  Hematological:  Negative for adenopathy. Does not bruise/bleed easily.  Psychiatric/Behavioral:  Negative for behavioral problems (Depression), sleep disturbance and suicidal ideas. The patient is not nervous/anxious.     Vital Signs: BP 121/65   Pulse 62   Temp 97.8 F (36.6 C)   Resp 16   Ht 5' (1.524 m)   Wt 170 lb (77.1 kg)   SpO2 96%   BMI 33.20 kg/m    Physical Exam Vitals reviewed.  Constitutional:      General: She is not in acute distress.    Appearance: Normal appearance. She is not ill-appearing.  HENT:     Head: Normocephalic and atraumatic.  Eyes:     Pupils: Pupils are equal, round, and reactive to light.  Cardiovascular:     Rate and Rhythm: Normal rate and regular rhythm.  Pulmonary:     Effort: Pulmonary effort is normal. No respiratory distress.  Neurological:     Mental Status: She is alert and oriented to person, place, and time.  Psychiatric:        Mood and Affect: Mood normal.        Behavior: Behavior normal.        Assessment/Plan: 1. Primary hypertension Stable, continue current medications as prescribed  2. Mixed hyperlipidemia Repeat lipid panel - Lipid Profile  3. Overweight with body mass index (BMI) of 29  to 29.9 in adult Will try qsymia, prescription sent to pharmacy, follow up in 4 weeks.    General Counseling: Jordan Carpenter understanding of the findings of todays visit and agrees with plan of treatment. I have discussed any further diagnostic evaluation that may be needed or ordered today. We also reviewed her medications today. she has been encouraged to call the office with any questions or concerns that should arise related to todays visit.    Orders Placed This Encounter  Procedures   Lipid Profile    Meds ordered this encounter  Medications   Phentermine-Topiramate (QSYMIA) 3.75-23 MG CP24    Sig: Take 1 capsule by mouth  daily before breakfast.    Dispense:  30 capsule    Refill:  0    Dx code E66.01    Return in about 4 weeks (around 02/25/2022) for F/U, Weight loss, Shravan Salahuddin PCP or DFK.   Total time spent:30 Minutes Time spent includes review of chart, medications, test results, and follow up plan with the patient.   [place] Controlled Substance Database was reviewed by me.  This patient was seen by Sallyanne Kuster, FNP-C in collaboration with Dr. Beverely Risen as a part of collaborative care agreement.   Lynell Kussman R. Tedd Sias, MSN, FNP-C Internal medicine

## 2022-02-04 ENCOUNTER — Other Ambulatory Visit: Payer: Self-pay

## 2022-02-04 MED ORDER — QSYMIA 3.75-23 MG PO CP24: 1.0000 | ORAL_CAPSULE | Freq: Every day | ORAL | 0 refills | Status: DC

## 2022-02-07 ENCOUNTER — Other Ambulatory Visit: Payer: Self-pay

## 2022-02-07 MED ORDER — QSYMIA 3.75-23 MG PO CP24
1.0000 | ORAL_CAPSULE | Freq: Every day | ORAL | 0 refills | Status: DC
Start: 2022-02-07 — End: 2023-02-05

## 2022-02-17 ENCOUNTER — Other Ambulatory Visit: Payer: Self-pay | Admitting: Internal Medicine

## 2022-02-17 ENCOUNTER — Encounter: Payer: Self-pay | Admitting: Nurse Practitioner

## 2022-02-17 MED ORDER — FLUTICASONE PROPIONATE 50 MCG/ACT NA SUSP: NASAL | 3 refills | Status: DC

## 2022-02-22 ENCOUNTER — Ambulatory Visit: Payer: Medicare Other | Admitting: Nurse Practitioner

## 2022-02-22 ENCOUNTER — Ambulatory Visit: Payer: Medicare Other | Admitting: Internal Medicine

## 2022-02-25 ENCOUNTER — Ambulatory Visit: Payer: Medicare Other | Admitting: Nurse Practitioner

## 2022-03-14 ENCOUNTER — Encounter: Payer: Self-pay | Admitting: Nurse Practitioner

## 2022-03-14 NOTE — Telephone Encounter (Signed)
Is she vaccinated?? COPD

## 2022-03-20 ENCOUNTER — Encounter: Payer: Self-pay | Admitting: Nurse Practitioner

## 2022-04-18 ENCOUNTER — Encounter (INDEPENDENT_AMBULATORY_CARE_PROVIDER_SITE_OTHER): Payer: Self-pay

## 2022-05-22 ENCOUNTER — Other Ambulatory Visit: Payer: Self-pay | Admitting: Nurse Practitioner

## 2022-05-22 DIAGNOSIS — K219 Gastro-esophageal reflux disease without esophagitis: Secondary | ICD-10-CM

## 2022-06-28 ENCOUNTER — Other Ambulatory Visit: Payer: Self-pay | Admitting: Nurse Practitioner

## 2022-06-28 DIAGNOSIS — E782 Mixed hyperlipidemia: Secondary | ICD-10-CM

## 2022-06-28 DIAGNOSIS — I10 Essential (primary) hypertension: Secondary | ICD-10-CM

## 2022-06-28 NOTE — Telephone Encounter (Signed)
PT NEED APPT FOR REFILLS  

## 2022-07-08 ENCOUNTER — Other Ambulatory Visit: Payer: Self-pay | Admitting: Nurse Practitioner

## 2022-07-08 DIAGNOSIS — E782 Mixed hyperlipidemia: Secondary | ICD-10-CM

## 2022-07-11 ENCOUNTER — Ambulatory Visit: Payer: Medicare Other | Admitting: Nurse Practitioner

## 2022-07-27 ENCOUNTER — Encounter: Payer: Self-pay | Admitting: Nurse Practitioner

## 2022-07-27 ENCOUNTER — Ambulatory Visit (INDEPENDENT_AMBULATORY_CARE_PROVIDER_SITE_OTHER): Payer: Medicare Other | Admitting: Nurse Practitioner

## 2022-07-27 VITALS — BP 139/75 | HR 67 | Temp 98.4°F | Resp 16 | Ht 60.0 in | Wt 174.8 lb

## 2022-07-27 DIAGNOSIS — E559 Vitamin D deficiency, unspecified: Secondary | ICD-10-CM | POA: Diagnosis not present

## 2022-07-27 DIAGNOSIS — R300 Dysuria: Secondary | ICD-10-CM

## 2022-07-27 DIAGNOSIS — K219 Gastro-esophageal reflux disease without esophagitis: Secondary | ICD-10-CM

## 2022-07-27 DIAGNOSIS — E782 Mixed hyperlipidemia: Secondary | ICD-10-CM

## 2022-07-27 DIAGNOSIS — Z0001 Encounter for general adult medical examination with abnormal findings: Secondary | ICD-10-CM | POA: Diagnosis not present

## 2022-07-27 DIAGNOSIS — E538 Deficiency of other specified B group vitamins: Secondary | ICD-10-CM

## 2022-07-27 DIAGNOSIS — I10 Essential (primary) hypertension: Secondary | ICD-10-CM

## 2022-07-27 MED ORDER — PANTOPRAZOLE SODIUM 40 MG PO TBEC: 40.0000 mg | DELAYED_RELEASE_TABLET | Freq: Two times a day (BID) | ORAL | 3 refills | Status: DC

## 2022-07-27 MED ORDER — ROSUVASTATIN CALCIUM 40 MG PO TABS: 40.0000 mg | ORAL_TABLET | Freq: Every day | ORAL | 0 refills | Status: DC

## 2022-07-27 MED ORDER — LISINOPRIL-HYDROCHLOROTHIAZIDE 20-25 MG PO TABS: 1.0000 | ORAL_TABLET | Freq: Every day | ORAL | 3 refills | Status: DC

## 2022-07-27 MED ORDER — FLUTICASONE PROPIONATE 50 MCG/ACT NA SUSP: NASAL | 3 refills | Status: DC

## 2022-07-27 NOTE — Progress Notes (Signed)
[facility] [place], [place] [address]  Internal MEDICINE  Office Visit Note  Patient Name: Jordan Carpenter  [id number]  [id number]  Date of Service: 07/27/2022  Chief Complaint  Patient presents with   Medicare Wellness   Gastroesophageal Reflux   Hyperlipidemia   Hypertension    HPI Jordan Carpenter presents for an annual well visit and physical exam.  Well-appearing 72 y.o. female with hypertension, migraines, GERD, arthritis and high cholesterol.  Routine CRC screening: 2021, normal due in 2031 Routine mammogram: April 2023 DEXA scan: 2022 Labs: due for routine labs  New or worsening pain: none Other concerns: none    Current Medication: Outpatient Encounter Medications as of 07/27/2022  Medication Sig   calcium carbonate (OS-CAL) 1250 (500 Ca) MG chewable tablet Chew 1 tablet by mouth daily.   celecoxib (CELEBREX) 200 MG capsule Take 1 capsule (200 mg total) by mouth 2 (two) times daily.   cetirizine (ZYRTEC) 10 MG tablet Take by mouth.   Cholecalciferol 25 MCG (1000 UT) capsule Take by mouth.   CINNAMON PO Take 600 mg by mouth in the morning and at bedtime.   Coenzyme Q10 400 MG CAPS Take by mouth.   Elderberry 500 MG CAPS Take by mouth.   ezetimibe (ZETIA) 10 MG tablet TAKE 1 TABLET DAILY   Omega-3 Fatty Acids (FISH OIL CONCENTRATE) 1000 MG CAPS    Phentermine-Topiramate (QSYMIA) 3.75-23 MG CP24 Take 1 capsule by mouth daily before breakfast.   ranitidine (ZANTAC) 300 MG tablet    SUMAtriptan (IMITREX) 20 MG/ACT nasal spray Place into the nose.   [DISCONTINUED] fluticasone (FLONASE) 50 MCG/ACT nasal spray 1-2 spray in each nostril once or twice day   [DISCONTINUED] lisinopril-hydrochlorothiazide (ZESTORETIC) 20-25 MG tablet TAKE 1 TABLET DAILY   [DISCONTINUED] pantoprazole (PROTONIX) 40 MG tablet TAKE 1 TABLET TWICE A DAY   [DISCONTINUED] rosuvastatin (CRESTOR) 40 MG tablet TAKE 1 TABLET DAILY   fluticasone (FLONASE) 50 MCG/ACT nasal spray 1-2 spray in each  nostril once or twice day   lisinopril-hydrochlorothiazide (ZESTORETIC) 20-25 MG tablet Take 1 tablet by mouth daily.   pantoprazole (PROTONIX) 40 MG tablet Take 1 tablet (40 mg total) by mouth 2 (two) times daily.   rosuvastatin (CRESTOR) 40 MG tablet Take 1 tablet (40 mg total) by mouth daily.   No facility-administered encounter medications on file as of 07/27/2022.    Surgical History: Past Surgical History:  Procedure Laterality Date   BACK SURGERY Left    2004   BREAST EXCISIONAL BIOPSY Right 1997   neg   CATARACT EXTRACTION Bilateral    eye lift Left    KNEE SURGERY Left    2007 AGAIN   REPLACEMENT TOTAL KNEE     TUBAL LIGATION      Medical History: Past Medical History:  Diagnosis Date   GERD (gastroesophageal reflux disease)    Hyperlipidemia    Hypertension     Family History: Family History  Problem Relation Age of Onset   Lung cancer Mother    Lymphoma Father    Lung cancer Brother    Breast cancer Maternal Aunt 60   Ovarian cancer Paternal Aunt    Kidney cancer Maternal Grandmother    Breast cancer Paternal Grandmother     Social History   Socioeconomic History   Marital status: Married    Spouse name: Not on file   Number of children: Not on file   Years of education: Not on file   Highest education level: Not on  file  Occupational History   Not on file  Tobacco Use   Smoking status: Never    Passive exposure: Never   Smokeless tobacco: Never  Substance and Sexual Activity   Alcohol use: Yes    Alcohol/week: 1.0 standard drink of alcohol    Types: 1 Glasses of wine per week    Comment: occ.   Drug use: Never   Sexual activity: Not on file  Other Topics Concern   Not on file  Social History Narrative   Not on file   Social Determinants of Health   Financial Resource Strain: Not on file  Food Insecurity: Not on file  Transportation Needs: Not on file  Physical Activity: Not on file  Stress: Not on file  Social Connections: Not on  file  Intimate Partner Violence: Not on file      Review of Systems  Constitutional:  Negative for activity change, appetite change, chills, fatigue, fever and unexpected weight change.  HENT: Negative.  Negative for congestion, ear pain, rhinorrhea, sore throat and trouble swallowing.   Eyes: Negative.   Respiratory: Negative.  Negative for cough, chest tightness, shortness of breath and wheezing.   Cardiovascular: Negative.  Negative for chest pain.  Gastrointestinal: Negative.  Negative for abdominal pain, blood in stool, constipation, diarrhea, nausea and vomiting.  Endocrine: Negative.   Genitourinary: Negative.  Negative for difficulty urinating, dysuria, frequency, hematuria and urgency.  Musculoskeletal: Negative.  Negative for arthralgias, back pain, joint swelling, myalgias and neck pain.  Skin: Negative.  Negative for rash and wound.  Allergic/Immunologic: Negative.  Negative for immunocompromised state.  Neurological: Negative.  Negative for dizziness, seizures, numbness and headaches.  Hematological: Negative.   Psychiatric/Behavioral: Negative.  Negative for behavioral problems, self-injury and suicidal ideas. The patient is not nervous/anxious.     Vital Signs: BP 139/75   Pulse 67   Temp 98.4 F (36.9 C)   Resp 16   Ht 5' (1.524 m)   Wt 174 lb 12.8 oz (79.3 kg)   SpO2 93%   BMI 34.14 kg/m    Physical Exam Vitals reviewed.  Constitutional:      General: She is awake. She is not in acute distress.    Appearance: Normal appearance. She is well-developed and well-groomed. She is obese. She is not ill-appearing or diaphoretic.  HENT:     Head: Normocephalic and atraumatic.     Right Ear: Tympanic membrane, ear canal and external ear normal.     Left Ear: Tympanic membrane, ear canal and external ear normal.     Nose: Nose normal. No congestion or rhinorrhea.     Mouth/Throat:     Lips: Pink.     Mouth: Mucous membranes are moist.     Pharynx: Oropharynx is  clear. Uvula midline. No oropharyngeal exudate or posterior oropharyngeal erythema.  Eyes:     General: Lids are normal. Vision grossly intact. Gaze aligned appropriately. No scleral icterus.       Right eye: No discharge.        Left eye: No discharge.     Extraocular Movements: Extraocular movements intact.     Conjunctiva/sclera: Conjunctivae normal.     Pupils: Pupils are equal, round, and reactive to light.     Funduscopic exam:    Right eye: Red reflex present.        Left eye: Red reflex present. Neck:     Thyroid: No thyromegaly.     Vascular: No carotid bruit or JVD.  Trachea: No tracheal deviation.  Cardiovascular:     Rate and Rhythm: Normal rate and regular rhythm.     Pulses: Normal pulses.     Heart sounds: Normal heart sounds, S1 normal and S2 normal. No murmur heard.    No friction rub. No gallop.  Pulmonary:     Effort: Pulmonary effort is normal. No accessory muscle usage or respiratory distress.     Breath sounds: Normal breath sounds and air entry. No stridor. No wheezing or rales.  Chest:     Chest wall: No tenderness.     Comments: Declined breast exam Abdominal:     General: Bowel sounds are normal. There is no distension.     Palpations: Abdomen is soft. There is no shifting dullness, fluid wave, mass or pulsatile mass.     Tenderness: There is no abdominal tenderness. There is no guarding or rebound.  Musculoskeletal:        General: No tenderness or deformity. Normal range of motion.     Cervical back: Normal range of motion and neck supple.     Right lower leg: No edema.     Left lower leg: No edema.  Lymphadenopathy:     Cervical: No cervical adenopathy.  Skin:    General: Skin is warm and dry.     Coloration: Skin is not pale.     Findings: No erythema or rash.  Neurological:     Mental Status: She is alert.     Cranial Nerves: No cranial nerve deficit.     Motor: No abnormal muscle tone.     Coordination: Coordination normal.     Deep  Tendon Reflexes: Reflexes are normal and symmetric.  Psychiatric:        Behavior: Behavior normal. Behavior is cooperative.        Thought Content: Thought content normal.        Judgment: Judgment normal.        Assessment/Plan: 1. Encounter for routine adult health examination with abnormal findings Age-appropriate preventive screenings and vaccinations discussed, annual physical exam completed. Routine labs for health maintenance ordered, see below. PHM updated. Medication refills ordered as well.  - CBC with Differential/Platelet - CMP14+EGFR - Lipid Profile - TSH + free T4 - Vitamin D (25 hydroxy) - B12 and Folate Panel - pantoprazole (PROTONIX) 40 MG tablet; Take 1 tablet (40 mg total) by mouth 2 (two) times daily.  Dispense: 180 tablet; Refill: 3 - rosuvastatin (CRESTOR) 40 MG tablet; Take 1 tablet (40 mg total) by mouth daily.  Dispense: 90 tablet; Refill: 0 - lisinopril-hydrochlorothiazide (ZESTORETIC) 20-25 MG tablet; Take 1 tablet by mouth daily.  Dispense: 90 tablet; Refill: 3 - fluticasone (FLONASE) 50 MCG/ACT nasal spray; 1-2 spray in each nostril once or twice day  Dispense: 33.3 mL; Refill: 3  2. Primary hypertension Routine labs ordered. Continue lisinopril-HCTZ as prescribed.  - CBC with Differential/Platelet - CMP14+EGFR - Lipid Profile - TSH + free T4 - Vitamin D (25 hydroxy) - B12 and Folate Panel - lisinopril-hydrochlorothiazide (ZESTORETIC) 20-25 MG tablet; Take 1 tablet by mouth daily.  Dispense: 90 tablet; Refill: 3  3. Mixed hyperlipidemia Routine labs ordered. Continue rosuvastatin as prescribed.  - CMP14+EGFR - Lipid Profile - TSH + free T4 - rosuvastatin (CRESTOR) 40 MG tablet; Take 1 tablet (40 mg total) by mouth daily.  Dispense: 90 tablet; Refill: 0  4. Vitamin D deficiency Routine lab ordered - Vitamin D (25 hydroxy)  5. B12 deficiency Routine labs ordered - CBC  with Differential/Platelet - B12 and Folate Panel  6. Dysuria Routine  urinalysis done - UA/M w/rflx Culture, Routine - Microscopic Examination      General Counseling: Jordan Carpenter verbalizes understanding of the findings of todays visit and agrees with plan of treatment. I have discussed any further diagnostic evaluation that may be needed or ordered today. We also reviewed her medications today. she has been encouraged to call the office with any questions or concerns that should arise related to todays visit.    Orders Placed This Encounter  Procedures   UA/M w/rflx Culture, Routine   CBC with Differential/Platelet   CMP14+EGFR   Lipid Profile   TSH + free T4   Vitamin D (25 hydroxy)   B12 and Folate Panel    Meds ordered this encounter  Medications   pantoprazole (PROTONIX) 40 MG tablet    Sig: Take 1 tablet (40 mg total) by mouth 2 (two) times daily.    Dispense:  180 tablet    Refill:  3   rosuvastatin (CRESTOR) 40 MG tablet    Sig: Take 1 tablet (40 mg total) by mouth daily.    Dispense:  90 tablet    Refill:  0    Fill for 90 day supply   lisinopril-hydrochlorothiazide (ZESTORETIC) 20-25 MG tablet    Sig: Take 1 tablet by mouth daily.    Dispense:  90 tablet    Refill:  3    Fill for 90 day supply   fluticasone (FLONASE) 50 MCG/ACT nasal spray    Sig: 1-2 spray in each nostril once or twice day    Dispense:  33.3 mL    Refill:  3    Please provide 3 month supply with each full thanks    Return in about 6 months (around 01/25/2023) for F/U, Jordan Carpenter PCP.   Total time spent:30 Minutes Time spent includes review of chart, medications, test results, and follow up plan with the patient.   [place] Controlled Substance Database was reviewed by me.  This patient was seen by Jonetta Osgood, FNP-C in collaboration with Dr. Clayborn Bigness as a part of collaborative care agreement.  Jesicca Dipierro R. Valetta Fuller, MSN, FNP-C Internal medicine

## 2022-07-28 LAB — CMP14+EGFR
ALT: 20 IU/L (ref 0–32)
AST: 24 IU/L (ref 0–40)
Albumin/Globulin Ratio: 2 (ref 1.2–2.2)
Albumin: 4.5 g/dL (ref 3.8–4.8)
Alkaline Phosphatase: 65 IU/L (ref 44–121)
BUN/Creatinine Ratio: 21 (ref 12–28)
BUN: 15 mg/dL (ref 8–27)
Bilirubin Total: 0.7 mg/dL (ref 0.0–1.2)
CO2: 25 mmol/L (ref 20–29)
Calcium: 10.1 mg/dL (ref 8.7–10.3)
Chloride: 100 mmol/L (ref 96–106)
Creatinine, Ser: 0.7 mg/dL (ref 0.57–1.00)
Globulin, Total: 2.3 g/dL (ref 1.5–4.5)
Glucose: 85 mg/dL (ref 70–99)
Potassium: 3.7 mmol/L (ref 3.5–5.2)
Sodium: 141 mmol/L (ref 134–144)
Total Protein: 6.8 g/dL (ref 6.0–8.5)
eGFR: 91 mL/min/{1.73_m2} (ref >59)

## 2022-07-28 LAB — MICROSCOPIC EXAMINATION
Bacteria, UA: NONE SEEN
Casts: NONE SEEN /lpf
Epithelial Cells (non renal): NONE SEEN /hpf (ref 0–10)
WBC, UA: NONE SEEN /hpf (ref 0–5)

## 2022-07-28 LAB — CBC WITH DIFFERENTIAL/PLATELET
Basophils Absolute: 0.1 10*3/uL (ref 0.0–0.2)
Basos: 1 %
EOS (ABSOLUTE): 0 10*3/uL (ref 0.0–0.4)
Eos: 1 %
Hematocrit: 44.3 % (ref 34.0–46.6)
Hemoglobin: 14.6 g/dL (ref 11.1–15.9)
Immature Grans (Abs): 0 10*3/uL (ref 0.0–0.1)
Immature Granulocytes: 0 %
Lymphocytes Absolute: 1.6 10*3/uL (ref 0.7–3.1)
Lymphs: 23 %
MCH: 28.7 pg (ref 26.6–33.0)
MCHC: 33 g/dL (ref 31.5–35.7)
MCV: 87 fL (ref 79–97)
Monocytes Absolute: 0.5 10*3/uL (ref 0.1–0.9)
Monocytes: 7 %
Neutrophils Absolute: 4.6 10*3/uL (ref 1.4–7.0)
Neutrophils: 68 %
Platelets: 201 10*3/uL (ref 150–450)
RBC: 5.09 x10E6/uL (ref 3.77–5.28)
RDW: 12.8 % (ref 11.7–15.4)
WBC: 6.7 10*3/uL (ref 3.4–10.8)

## 2022-07-28 LAB — UA/M W/RFLX CULTURE, ROUTINE
Bilirubin, UA: NEGATIVE
Glucose, UA: NEGATIVE
Leukocytes,UA: NEGATIVE
Nitrite, UA: NEGATIVE
Protein,UA: NEGATIVE
RBC, UA: NEGATIVE
Specific Gravity, UA: 1.017 (ref 1.005–1.030)
Urobilinogen, Ur: 0.2 mg/dL (ref 0.2–1.0)
pH, UA: 6 (ref 5.0–7.5)

## 2022-07-28 LAB — LIPID PANEL
Chol/HDL Ratio: 2.2 ratio (ref 0.0–4.4)
Cholesterol, Total: 199 mg/dL (ref 100–199)
HDL: 90 mg/dL
LDL Chol Calc (NIH): 94 mg/dL (ref 0–99)
Triglycerides: 86 mg/dL (ref 0–149)
VLDL Cholesterol Cal: 15 mg/dL (ref 5–40)

## 2022-07-28 LAB — VITAMIN D 25 HYDROXY (VIT D DEFICIENCY, FRACTURES): Vit D, 25-Hydroxy: 93.8 ng/mL (ref 30.0–100.0)

## 2022-07-28 LAB — TSH+FREE T4
Free T4: 1.56 ng/dL (ref 0.82–1.77)
TSH: 0.743 u[IU]/mL (ref 0.450–4.500)

## 2022-07-28 LAB — B12 AND FOLATE PANEL
Folate: 17.5 ng/mL (ref >3.0)
Vitamin B-12: 711 pg/mL (ref 232–1245)

## 2022-08-01 ENCOUNTER — Encounter: Payer: Self-pay | Admitting: Nurse Practitioner

## 2022-08-30 NOTE — Progress Notes (Signed)
All of the lab results are normal, repeat annual labs next year for annual wellness visit.

## 2022-09-06 ENCOUNTER — Other Ambulatory Visit: Payer: Self-pay | Admitting: Internal Medicine

## 2022-09-06 DIAGNOSIS — Z1231 Encounter for screening mammogram for malignant neoplasm of breast: Secondary | ICD-10-CM

## 2022-09-26 ENCOUNTER — Ambulatory Visit: Payer: Medicare Other | Admitting: Nurse Practitioner

## 2022-09-27 ENCOUNTER — Ambulatory Visit: Payer: Medicare Other | Admitting: Nurse Practitioner

## 2022-10-10 ENCOUNTER — Ambulatory Visit: Payer: Medicare Other | Admitting: Podiatry

## 2022-10-17 ENCOUNTER — Ambulatory Visit
Admission: RE | Admit: 2022-10-17 | Discharge: 2022-10-17 | Disposition: A | Discharge status: Home / Self Care | Payer: Medicare Other | Source: Ambulatory Visit | Attending: Internal Medicine | Admitting: Internal Medicine

## 2022-10-17 DIAGNOSIS — Z1231 Encounter for screening mammogram for malignant neoplasm of breast: Secondary | ICD-10-CM

## 2022-10-25 ENCOUNTER — Other Ambulatory Visit: Payer: Self-pay | Admitting: Nurse Practitioner

## 2022-10-25 DIAGNOSIS — E782 Mixed hyperlipidemia: Secondary | ICD-10-CM

## 2022-10-25 DIAGNOSIS — Z0001 Encounter for general adult medical examination with abnormal findings: Secondary | ICD-10-CM

## 2022-10-26 ENCOUNTER — Ambulatory Visit (INDEPENDENT_AMBULATORY_CARE_PROVIDER_SITE_OTHER): Payer: Medicare Other | Admitting: Podiatry

## 2022-10-26 ENCOUNTER — Ambulatory Visit: Payer: Medicare Other

## 2022-10-26 ENCOUNTER — Ambulatory Visit (INDEPENDENT_AMBULATORY_CARE_PROVIDER_SITE_OTHER): Payer: Medicare Other

## 2022-10-26 DIAGNOSIS — M205X2 Other deformities of toe(s) (acquired), left foot: Secondary | ICD-10-CM | POA: Diagnosis not present

## 2022-10-26 DIAGNOSIS — G5780 Other specified mononeuropathies of unspecified lower limb: Secondary | ICD-10-CM

## 2022-10-26 DIAGNOSIS — M205X1 Other deformities of toe(s) (acquired), right foot: Secondary | ICD-10-CM

## 2022-10-26 DIAGNOSIS — G5761 Lesion of plantar nerve, right lower limb: Secondary | ICD-10-CM | POA: Diagnosis not present

## 2022-10-26 NOTE — Progress Notes (Signed)
Subjective:  Patient ID: Jordan Carpenter, female    DOB: [birth year],  MRN: [id number] HPI Chief Complaint  Patient presents with   Foot Pain    Patient came in today for left foot pain, lateral side of the top of the foot, and ball of the foot pain, patient also had back surgery and has numbness on the side of the foot     72 y.o. female presents with the above complaint.   ROS: Denies fever chills nausea vomit muscle aches pains calf pain back pain chest pain shortness of breath.  Past Medical History:  Diagnosis Date   GERD (gastroesophageal reflux disease)    Hyperlipidemia    Hypertension    Past Surgical History:  Procedure Laterality Date   BACK SURGERY Left    2004   BREAST EXCISIONAL BIOPSY Right 1997   neg   CATARACT EXTRACTION Bilateral    eye lift Left    KNEE SURGERY Left    2007 AGAIN   REPLACEMENT TOTAL KNEE     TUBAL LIGATION      Current Outpatient Medications:    calcium carbonate (OS-CAL) 1250 (500 Ca) MG chewable tablet, Chew 1 tablet by mouth daily., Disp: , Rfl:    celecoxib (CELEBREX) 200 MG capsule, Take 1 capsule (200 mg total) by mouth 2 (two) times daily., Disp: 180 capsule, Rfl: 1   cetirizine (ZYRTEC) 10 MG tablet, Take by mouth., Disp: , Rfl:    Cholecalciferol 25 MCG (1000 UT) capsule, Take by mouth., Disp: , Rfl:    CINNAMON PO, Take 600 mg by mouth in the morning and at bedtime., Disp: , Rfl:    Coenzyme Q10 400 MG CAPS, Take by mouth., Disp: , Rfl:    Elderberry 500 MG CAPS, Take by mouth., Disp: , Rfl:    ezetimibe (ZETIA) 10 MG tablet, TAKE 1 TABLET DAILY, Disp: 90 tablet, Rfl: 3   fluticasone (FLONASE) 50 MCG/ACT nasal spray, 1-2 spray in each nostril once or twice day, Disp: 33.3 mL, Rfl: 3   lisinopril-hydrochlorothiazide (ZESTORETIC) 20-25 MG tablet, Take 1 tablet by mouth daily., Disp: 90 tablet, Rfl: 3   Omega-3 Fatty Acids (FISH OIL CONCENTRATE) 1000 MG CAPS, , Disp: , Rfl:    pantoprazole (PROTONIX) 40 MG tablet, Take 1 tablet (40 mg  total) by mouth 2 (two) times daily., Disp: 180 tablet, Rfl: 3   Phentermine-Topiramate (QSYMIA) 3.75-23 MG CP24, Take 1 capsule by mouth daily before breakfast., Disp: 30 capsule, Rfl: 0   ranitidine (ZANTAC) 300 MG tablet, , Disp: , Rfl:    rosuvastatin (CRESTOR) 40 MG tablet, TAKE 1 TABLET DAILY, Disp: 90 tablet, Rfl: 3   SUMAtriptan (IMITREX) 20 MG/ACT nasal spray, Place into the nose., Disp: , Rfl:   Allergies  Allergen Reactions   Sulfa Antibiotics Rash   Sulfamethoxazole-Trimethoprim Rash   Review of Systems Objective:  There were no vitals filed for this visit.  General: Well developed, nourished, in no acute distress, alert and oriented x3   Dermatological: Skin is warm, dry and supple bilateral. Nails x 10 are well maintained; remaining integument appears unremarkable at this time. There are no open sores, no preulcerative lesions, no rash or signs of infection present.  Vascular: Dorsalis Pedis artery and Posterior Tibial artery pedal pulses are 2/4 bilateral with immedate capillary fill time. Pedal hair growth present. No varicosities and no lower extremity edema present bilateral.   Neruologic: Grossly intact via light touch bilateral. Vibratory intact via tuning fork bilateral. Protective threshold with  Semmes Wienstein monofilament intact to all pedal sites bilateral. Patellar and Achilles deep tendon reflexes 2+ bilateral. No Babinski or clonus noted bilateral.  Entire left of the left foot is numb.  She has a palpable Mulder's click to the third interdigital space of the left foot.  Musculoskeletal: No gross boney pedal deformities bilateral. No pain, crepitus, or limitation noted with foot and ankle range of motion bilateral. Muscular strength 5/5 in all groups tested bilateral.  Gait: Unassisted, Nonantalgic.    Radiographs:  Left foot demonstrates osseously mature individual mild demineralization.  Some osteoarthritic changes of the midfoot.  Retrocalcaneal heel  spurs present.  No significant forefoot pathology appreciated.  Assessment & Plan:   Assessment: Neuroma third interdigital space left foot.  Plan: Injected the neuroma today with Kenalog 10 mg third interdigital space.  Tolerated procedure well discussed appropriate shoe gear discussed possible need for dehydrated alcohol follow-up with her in 1 month     Artice Holohan T. [place], [place]

## 2022-11-30 ENCOUNTER — Encounter: Payer: Self-pay | Admitting: Podiatry

## 2022-11-30 ENCOUNTER — Ambulatory Visit (INDEPENDENT_AMBULATORY_CARE_PROVIDER_SITE_OTHER): Payer: Medicare Other | Admitting: Podiatry

## 2022-11-30 DIAGNOSIS — G5782 Other specified mononeuropathies of left lower limb: Secondary | ICD-10-CM | POA: Diagnosis not present

## 2022-11-30 NOTE — Progress Notes (Signed)
She presents today for follow-up of her neuroma third interdigital space of her left foot states that it sounds still hurting me to some degree [place] similar to try those shots.  Objective: Vital signs are stable alert oriented x 3 there is no erythema edema salines drainage or odor still has pain on palpation to the third interdigital space of the left foot with palpable Mulder's click.  Assessment: Pain in limb secondary to neuroma.  Failed conservative cortisone injection.  Plan: We are going to start her with her first dose of dehydrated alcohol 4% 1.5 cc was injected.  She tolerated procedure well we will follow-up with her in 3 to 4 weeks

## 2022-12-21 ENCOUNTER — Ambulatory Visit (INDEPENDENT_AMBULATORY_CARE_PROVIDER_SITE_OTHER): Payer: Medicare Other | Admitting: Podiatry

## 2022-12-21 ENCOUNTER — Encounter: Payer: Self-pay | Admitting: Podiatry

## 2022-12-21 DIAGNOSIS — G5782 Other specified mononeuropathies of left lower limb: Secondary | ICD-10-CM

## 2022-12-21 NOTE — Progress Notes (Signed)
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  States that it still hurts but is better than it was before.  She received her first dehydrated alcohol injection last visit.  Objective: Vital signs stable oriented x 3 pain on palpation to the third interdigital space left foot.  Palpable Mulder's click is noted.  Assessment: Neuroma third interdigital space left.  Plan: Injected her second dose of dehydrated alcohol 4% x 2 cc.  Follow-up with her in 3 to 4 weeks.

## 2023-01-18 ENCOUNTER — Ambulatory Visit (INDEPENDENT_AMBULATORY_CARE_PROVIDER_SITE_OTHER): Payer: Medicare Other | Admitting: Podiatry

## 2023-01-18 ENCOUNTER — Encounter: Payer: Self-pay | Admitting: Podiatry

## 2023-01-18 DIAGNOSIS — G5782 Other specified mononeuropathies of left lower limb: Secondary | ICD-10-CM

## 2023-01-18 DIAGNOSIS — G5762 Lesion of plantar nerve, left lower limb: Secondary | ICD-10-CM

## 2023-01-18 NOTE — Progress Notes (Signed)
She presents today for follow-up of her neuroma third interdigital space of her left foot.  She states that she does not have pain in the neuroma every single day any longer.  Objective: Vital signs are stable alert and oriented x 3.  Pulses are palpable.  Tenderness on palpation to the third interdigital space of the left foot is noted today.  No open lesions or wounds are noted.  Assessment: Neuroma third interdigital space left slowly resolving after alcohol injections.  Plan: We provided her with her third 4% dehydrated alcohol injection to the third interdigital space of her left foot today I will follow-up with her in 3 to 4 weeks for her fourth dose if necessary.

## 2023-01-25 ENCOUNTER — Ambulatory Visit (INDEPENDENT_AMBULATORY_CARE_PROVIDER_SITE_OTHER): Payer: Medicare Other | Admitting: Nurse Practitioner

## 2023-01-25 ENCOUNTER — Encounter: Payer: Self-pay | Admitting: Nurse Practitioner

## 2023-01-25 VITALS — BP 136/82 | HR 65 | Temp 98.3°F | Resp 16 | Ht 63.0 in | Wt 178.0 lb

## 2023-01-25 DIAGNOSIS — E559 Vitamin D deficiency, unspecified: Secondary | ICD-10-CM

## 2023-01-25 DIAGNOSIS — E782 Mixed hyperlipidemia: Secondary | ICD-10-CM | POA: Diagnosis not present

## 2023-01-25 DIAGNOSIS — E6609 Other obesity due to excess calories: Secondary | ICD-10-CM

## 2023-01-25 DIAGNOSIS — R229 Localized swelling, mass and lump, unspecified: Secondary | ICD-10-CM

## 2023-01-25 DIAGNOSIS — Z6831 Body mass index (BMI) 31.0-31.9, adult: Secondary | ICD-10-CM

## 2023-01-25 DIAGNOSIS — I10 Essential (primary) hypertension: Secondary | ICD-10-CM

## 2023-01-25 DIAGNOSIS — K219 Gastro-esophageal reflux disease without esophagitis: Secondary | ICD-10-CM | POA: Diagnosis not present

## 2023-01-25 DIAGNOSIS — E538 Deficiency of other specified B group vitamins: Secondary | ICD-10-CM | POA: Diagnosis not present

## 2023-01-25 DIAGNOSIS — M159 Polyosteoarthritis, unspecified: Secondary | ICD-10-CM

## 2023-01-25 MED ORDER — FAMOTIDINE 20 MG PO TABS
20.0000 mg | ORAL_TABLET | Freq: Every day | ORAL | Status: AC
Start: 2023-01-25 — End: ?

## 2023-01-25 MED ORDER — TIRZEPATIDE-WEIGHT MANAGEMENT 2.5 MG/0.5ML ~~LOC~~ SOAJ
2.5000 mg | SUBCUTANEOUS | 0 refills | Status: DC
Start: 2023-01-25 — End: 2023-08-02

## 2023-01-25 MED ORDER — TIRZEPATIDE-WEIGHT MANAGEMENT 5 MG/0.5ML ~~LOC~~ SOAJ
5.0000 mg | SUBCUTANEOUS | 2 refills | Status: DC
Start: 2023-01-25 — End: 2023-02-22

## 2023-01-25 NOTE — Progress Notes (Signed)
[facility] [address] [place] [employer] [place], [place] [address]  Internal MEDICINE  Office Visit Note  Patient Name: Jordan Carpenter  [id number]  [id number]  Date of Service: 01/25/2023  Chief Complaint  Patient presents with   Follow-up    HPI Kaelene presents for a follow-up visit for GERD, small knot on leg, weight loss, routine labs  Small knot on the medial lower right leg -- not painful, but firm to touch. No redness or swelling.  Weight loss mangement -- unable to get some medications approved previously. Interested in [place] or zepbound if her insurance will cover it.  GERD -- added famotidine at night which has helped her acid reflux.  Upcoming annual visit -- due for routine labs.     Current Medication: Outpatient Encounter Medications as of 01/25/2023  Medication Sig   calcium carbonate (OS-CAL) 1250 (500 Ca) MG chewable tablet Chew 1 tablet by mouth daily.   cetirizine (ZYRTEC) 10 MG tablet Take by mouth.   Cholecalciferol 25 MCG (1000 UT) capsule Take by mouth.   CINNAMON PO Take 600 mg by mouth in the morning and at bedtime.   Coenzyme Q10 400 MG CAPS Take by mouth.   Elderberry 500 MG CAPS Take by mouth.   ezetimibe (ZETIA) 10 MG tablet TAKE 1 TABLET DAILY   famotidine (PEPCID) 20 MG tablet Take 1 tablet (20 mg total) by mouth at bedtime.   fluticasone (FLONASE) 50 MCG/ACT nasal spray 1-2 spray in each nostril once or twice day   lisinopril-hydrochlorothiazide (ZESTORETIC) 20-25 MG tablet Take 1 tablet by mouth daily.   Omega-3 Fatty Acids (FISH OIL CONCENTRATE) 1000 MG CAPS    pantoprazole (PROTONIX) 40 MG tablet Take 1 tablet (40 mg total) by mouth 2 (two) times daily.   Phentermine-Topiramate (QSYMIA) 3.75-23 MG CP24 Take 1 capsule by mouth daily before breakfast.   ranitidine (ZANTAC) 300 MG tablet    rosuvastatin (CRESTOR) 40 MG tablet TAKE 1 TABLET DAILY   SUMAtriptan (IMITREX) 20 MG/ACT nasal spray Place into the nose.   tirzepatide (ZEPBOUND) 2.5 MG/0.5ML Pen  Inject 2.5 mg into the skin once a week.   tirzepatide (ZEPBOUND) 5 MG/0.5ML Pen Inject 5 mg into the skin once a week.   [DISCONTINUED] celecoxib (CELEBREX) 200 MG capsule Take 1 capsule (200 mg total) by mouth 2 (two) times daily.   No facility-administered encounter medications on file as of 01/25/2023.    Surgical History: Past Surgical History:  Procedure Laterality Date   BACK SURGERY Left    2004   BREAST EXCISIONAL BIOPSY Right 1997   neg   CATARACT EXTRACTION Bilateral    eye lift Left    KNEE SURGERY Left    2007 AGAIN   REPLACEMENT TOTAL KNEE     TUBAL LIGATION      Medical History: Past Medical History:  Diagnosis Date   GERD (gastroesophageal reflux disease)    Hyperlipidemia    Hypertension     Family History: Family History  Problem Relation Age of Onset   Lung cancer Mother    Lymphoma Father    Lung cancer Brother    Breast cancer Maternal Aunt 60   Ovarian cancer Paternal Aunt    Kidney cancer Maternal Grandmother    Breast cancer Paternal Grandmother     Social History   Socioeconomic History   Marital status: Married    Spouse name: Not on file   Number of children: Not on file   Years of education: Not on file  Highest education level: Not on file  Occupational History   Not on file  Tobacco Use   Smoking status: Never    Passive exposure: Never   Smokeless tobacco: Never  Substance and Sexual Activity   Alcohol use: Yes    Alcohol/week: 1.0 standard drink of alcohol    Types: 1 Glasses of wine per week    Comment: occ.   Drug use: Never   Sexual activity: Not on file  Other Topics Concern   Not on file  Social History Narrative   Not on file   Social Determinants of Health   Financial Resource Strain: Not on file  Food Insecurity: Not on file  Transportation Needs: Not on file  Physical Activity: Not on file  Stress: Not on file  Social Connections: Not on file  Intimate Partner Violence: Not on file      Review of  Systems  Constitutional:  Positive for unexpected weight change (cannot lose weight despite decreasing calorie intake/carbs and walking several miles per week). Negative for chills and fatigue.  HENT:  Negative for congestion, rhinorrhea, sneezing and sore throat.   Eyes:  Negative for redness.  Respiratory: Negative.  Negative for cough, chest tightness, shortness of breath and wheezing.   Cardiovascular: Negative.  Negative for chest pain and palpitations.  Gastrointestinal:  Negative for abdominal pain, constipation, diarrhea, nausea and vomiting.  Genitourinary:  Negative for dysuria and frequency.  Musculoskeletal:  Negative for arthralgias, back pain, joint swelling and neck pain.  Skin:  Negative for rash.  Neurological: Negative.  Negative for tremors and numbness.  Hematological:  Negative for adenopathy. Does not bruise/bleed easily.  Psychiatric/Behavioral:  Negative for behavioral problems (Depression), sleep disturbance and suicidal ideas. The patient is not nervous/anxious.     Vital Signs: BP 136/82   Pulse 65   Temp 98.3 F (36.8 C)   Resp 16   Ht 5\' 3"  (1.6 m)   Wt 178 lb (80.7 kg)   SpO2 98%   BMI 31.53 kg/m    Physical Exam Vitals reviewed.  Constitutional:      General: She is not in acute distress.    Appearance: Normal appearance. She is obese. She is not ill-appearing.  HENT:     Head: Normocephalic and atraumatic.  Eyes:     Pupils: Pupils are equal, round, and reactive to light.  Cardiovascular:     Rate and Rhythm: Normal rate and regular rhythm.  Pulmonary:     Effort: Pulmonary effort is normal. No respiratory distress.  Neurological:     Mental Status: She is alert and oriented to person, place, and time.  Psychiatric:        Mood and Affect: Mood normal.        Behavior: Behavior normal.        Assessment/Plan: 1. Gastroesophageal reflux disease without esophagitis Continue OTC famotidine as discussed, routine labs ordered  -  famotidine (PEPCID) 20 MG tablet; Take 1 tablet (20 mg total) by mouth at bedtime. - CMP14+EGFR - Lipid Profile  2. Mixed hyperlipidemia Routine labs ordered  - CBC with Differential/Platelet - CMP14+EGFR - Lipid Profile  3. Localized skin mass, lump, or swelling Soft tissue ultrasound ordered  - [place] RT LOWER EXTREM LTD SOFT TISSUE NON VASCULAR; Future  4. B12 deficiency Routine labs ordered  - CBC with Differential/Platelet - CMP14+EGFR - B12 and Folate Panel  5. Vitamin D deficiency Routine lab ordered - Vitamin D (25 hydroxy)  6. Class 1 obesity due  to excess calories with serious comorbidity and body mass index (BMI) of 31.0 to 31.9 in adult Zepbound is covered by her insurance, prescribed the medication, will complete PA, follow up 2-3 months.  - tirzepatide (ZEPBOUND) 2.5 MG/0.5ML Pen; Inject 2.5 mg into the skin once a week.  Dispense: 6.5 mL; Refill: 0 - tirzepatide (ZEPBOUND) 5 MG/0.5ML Pen; Inject 5 mg into the skin once a week.  Dispense: 6.5 mL; Refill: 2 - CBC with Differential/Platelet - CMP14+EGFR - Lipid Profile - Vitamin D (25 hydroxy) - B12 and Folate Panel   General Counseling: Lotoya verbalizes understanding of the findings of todays visit and agrees with plan of treatment. I have discussed any further diagnostic evaluation that may be needed or ordered today. We also reviewed her medications today. she has been encouraged to call the office with any questions or concerns that should arise related to todays visit.    Orders Placed This Encounter  Procedures   [place] RT LOWER EXTREM LTD SOFT TISSUE NON VASCULAR   CBC with Differential/Platelet   CMP14+EGFR   Lipid Profile   Vitamin D (25 hydroxy)   B12 and Folate Panel    Meds ordered this encounter  Medications   famotidine (PEPCID) 20 MG tablet    Sig: Take 1 tablet (20 mg total) by mouth at bedtime.   tirzepatide (ZEPBOUND) 2.5 MG/0.5ML Pen    Sig: Inject 2.5 mg into the skin once a week.     Dispense:  6.5 mL    Refill:  0    Dx code E66.01. patient will start with 4 weeks of 2.5 mg then increase to the 5 mg dose.   tirzepatide (ZEPBOUND) 5 MG/0.5ML Pen    Sig: Inject 5 mg into the skin once a week.    Dispense:  6.5 mL    Refill:  2    Dx code E66.01. start this script after 4 weeks of 2.5 mg dose.    Return in 1 month (on 02/25/2023) for F/U ultrasound results and weight loss with Markell Sciascia PCP.   Total time spent:30 Minutes Time spent includes review of chart, medications, test results, and follow up plan with the patient.   [place] Controlled Substance Database was reviewed by me.  This patient was seen by Sallyanne Kuster, FNP-C in collaboration with Dr. Beverely Risen as a part of collaborative care agreement.   Orange Hilligoss R. Tedd Sias, MSN, FNP-C Internal medicine

## 2023-01-26 ENCOUNTER — Telehealth: Payer: Self-pay | Admitting: Nurse Practitioner

## 2023-01-26 ENCOUNTER — Encounter: Payer: Self-pay | Admitting: Nurse Practitioner

## 2023-01-26 DIAGNOSIS — E6609 Other obesity due to excess calories: Secondary | ICD-10-CM

## 2023-01-26 NOTE — Telephone Encounter (Signed)
Notified patient of U/S appointment date, arrival time, location-Jordan Carpenter

## 2023-02-01 ENCOUNTER — Ambulatory Visit
Admission: RE | Admit: 2023-02-01 | Discharge: 2023-02-01 | Disposition: A | Discharge status: Home / Self Care | Payer: Medicare Other | Source: Ambulatory Visit | Attending: Nurse Practitioner | Admitting: Nurse Practitioner

## 2023-02-01 DIAGNOSIS — R229 Localized swelling, mass and lump, unspecified: Secondary | ICD-10-CM | POA: Diagnosis not present

## 2023-02-05 ENCOUNTER — Encounter: Payer: Self-pay | Admitting: Nurse Practitioner

## 2023-02-05 ENCOUNTER — Other Ambulatory Visit: Payer: Self-pay | Admitting: Nurse Practitioner

## 2023-02-05 MED ORDER — QSYMIA 7.5-46 MG PO CP24: 1.0000 | ORAL_CAPSULE | Freq: Every day | ORAL | 0 refills | Status: DC

## 2023-02-05 MED ORDER — QSYMIA 3.75-23 MG PO CP24: 1.0000 | ORAL_CAPSULE | Freq: Every day | ORAL | 0 refills | Status: DC

## 2023-02-07 ENCOUNTER — Encounter: Payer: Self-pay | Admitting: Nurse Practitioner

## 2023-02-07 ENCOUNTER — Ambulatory Visit (INDEPENDENT_AMBULATORY_CARE_PROVIDER_SITE_OTHER): Payer: Medicare Other | Admitting: Nurse Practitioner

## 2023-02-07 VITALS — BP 115/70 | HR 79 | Temp 98.4°F | Resp 16 | Ht 63.0 in | Wt 178.2 lb

## 2023-02-07 DIAGNOSIS — R051 Acute cough: Secondary | ICD-10-CM

## 2023-02-07 DIAGNOSIS — J0100 Acute maxillary sinusitis, unspecified: Secondary | ICD-10-CM | POA: Diagnosis not present

## 2023-02-07 MED ORDER — HYDROCOD POLI-CHLORPHE POLI ER 10-8 MG/5ML PO SUER
5.0000 mL | Freq: Every evening | ORAL | 0 refills | Status: DC | PRN
Start: 2023-02-07 — End: 2023-05-15

## 2023-02-07 MED ORDER — AMOXICILLIN-POT CLAVULANATE 875-125 MG PO TABS
1.0000 | ORAL_TABLET | Freq: Two times a day (BID) | ORAL | 0 refills | Status: AC
Start: 2023-02-07 — End: 2023-02-17

## 2023-02-07 NOTE — Progress Notes (Signed)
[facility] [address] [place], [place] [address]  Internal MEDICINE  Office Visit Note  Patient Name: Jordan Carpenter  [id number]  [id number]  Date of Service: 02/07/2023  Chief Complaint  Patient presents with   Acute Visit   Cough    Coughing since last Thursday, patient thinks it is a GERD flare up.     Jordan Carpenter presents for a follow-up visit for symptoms of sinusitis Sinus drainage, headache, dry and hacking cough, headache, feverish, nasal congestion, sore throat sinus pressure.  Symptoms started last Wednesday.  Negative for covid  Taking sinus medication and it has not been helping     Current Medication: Outpatient Encounter Medications as of 02/07/2023  Medication Sig   amoxicillin-clavulanate (AUGMENTIN) 875-125 MG tablet Take 1 tablet by mouth 2 (two) times daily for 10 days. Take with food.   calcium carbonate (OS-CAL) 1250 (500 Ca) MG chewable tablet Chew 1 tablet by mouth daily.   cetirizine (ZYRTEC) 10 MG tablet Take by mouth.   chlorpheniramine-HYDROcodone (TUSSIONEX) 10-8 MG/5ML Take 5 mLs by mouth at bedtime as needed for cough.   Cholecalciferol 25 MCG (1000 UT) capsule Take by mouth.   CINNAMON PO Take 600 mg by mouth in the morning and at bedtime.   Coenzyme Q10 400 MG CAPS Take by mouth.   Elderberry 500 MG CAPS Take by mouth.   ezetimibe (ZETIA) 10 MG tablet TAKE 1 TABLET DAILY   famotidine (PEPCID) 20 MG tablet Take 1 tablet (20 mg total) by mouth at bedtime.   fluticasone (FLONASE) 50 MCG/ACT nasal spray 1-2 spray in each nostril once or twice day   lisinopril-hydrochlorothiazide (ZESTORETIC) 20-25 MG tablet Take 1 tablet by mouth daily.   Omega-3 Fatty Acids (FISH OIL CONCENTRATE) 1000 MG CAPS    pantoprazole (PROTONIX) 40 MG tablet Take 1 tablet (40 mg total) by mouth 2 (two) times daily.   Phentermine-Topiramate (QSYMIA) 3.75-23 MG CP24 Take 1 capsule by mouth daily before breakfast.   [START ON 02/27/2023] Phentermine-Topiramate (QSYMIA)  7.5-46 MG CP24 Take 1 capsule by mouth daily before breakfast.   ranitidine (ZANTAC) 300 MG tablet    rosuvastatin (CRESTOR) 40 MG tablet TAKE 1 TABLET DAILY   SUMAtriptan (IMITREX) 20 MG/ACT nasal spray Place into the nose.   tirzepatide (ZEPBOUND) 2.5 MG/0.5ML Pen Inject 2.5 mg into the skin once a week.   tirzepatide (ZEPBOUND) 5 MG/0.5ML Pen Inject 5 mg into the skin once a week.   No facility-administered encounter medications on file as of 02/07/2023.    Surgical History: Past Surgical History:  Procedure Laterality Date   BACK SURGERY Left    2004   BREAST EXCISIONAL BIOPSY Right 1997   neg   CATARACT EXTRACTION Bilateral    eye lift Left    KNEE SURGERY Left    2007 AGAIN   REPLACEMENT TOTAL KNEE     TUBAL LIGATION      Medical History: Past Medical History:  Diagnosis Date   GERD (gastroesophageal reflux disease)    Hyperlipidemia    Hypertension     Family History: Family History  Problem Relation Age of Onset   Lung cancer Mother    Lymphoma Father    Lung cancer Brother    Breast cancer Maternal Aunt 60   Ovarian cancer Paternal Aunt    Kidney cancer Maternal Grandmother    Breast cancer Paternal Grandmother     Social History   Socioeconomic History   Marital status: Married    Spouse name:  Not on file   Number of children: Not on file   Years of education: Not on file   Highest education level: Not on file  Occupational History   Not on file  Tobacco Use   Smoking status: Never    Passive exposure: Never   Smokeless tobacco: Never  Substance and Sexual Activity   Alcohol use: Yes    Alcohol/week: 1.0 standard drink of alcohol    Types: 1 Glasses of wine per week    Comment: occ.   Drug use: Never   Sexual activity: Not on file  Other Topics Concern   Not on file  Social History Narrative   Not on file   Social Determinants of Health   Financial Resource Strain: Not on file  Food Insecurity: Not on file  Transportation Needs: Not  on file  Physical Activity: Not on file  Stress: Not on file  Social Connections: Not on file  Intimate Partner Violence: Not on file      Review of Systems  Constitutional:  Positive for fatigue and fever. Negative for appetite change.  HENT:  Positive for congestion, ear pain, postnasal drip, rhinorrhea, sinus pressure and sore throat.   Respiratory:  Positive for cough. Negative for chest tightness, shortness of breath and wheezing.   Cardiovascular:  Negative for chest pain and palpitations.  Gastrointestinal:  Negative for nausea and vomiting.  Neurological:  Positive for headaches.    Vital Signs: BP 115/70   Pulse 79   Temp 98.4 F (36.9 C)   Resp 16   Ht 5\' 3"  (1.6 m)   Wt 178 lb 3.2 oz (80.8 kg)   SpO2 96%   BMI 31.57 kg/m    Physical Exam HENT:     Head: Normocephalic and atraumatic.     Right Ear: Swelling and tenderness present.     Left Ear: Swelling and tenderness present.     Nose: Mucosal edema, congestion and rhinorrhea present.     Right Turbinates: Swollen and pale.     Left Turbinates: Swollen and pale.     Right Sinus: Maxillary sinus tenderness present.     Left Sinus: Maxillary sinus tenderness present.     Mouth/Throat:     Lips: Pink.     Mouth: Mucous membranes are moist.     Pharynx: Pharyngeal swelling and posterior oropharyngeal erythema present.  Cardiovascular:     Rate and Rhythm: Normal rate and regular rhythm.     Heart sounds: Normal heart sounds, S1 normal and S2 normal.  Pulmonary:     Effort: Pulmonary effort is normal.     Breath sounds: Normal breath sounds and air entry. No decreased breath sounds, wheezing, rhonchi or rales.        Assessment/Plan: 1. Acute non-recurrent maxillary sinusitis Empiric antibiotic prescribed.  - amoxicillin-clavulanate (AUGMENTIN) 875-125 MG tablet; Take 1 tablet by mouth 2 (two) times daily for 10 days. Take with food.  Dispense: 20 tablet; Refill: 0  2. Acute cough Medication  prescribed for cough - chlorpheniramine-HYDROcodone (TUSSIONEX) 10-8 MG/5ML; Take 5 mLs by mouth at bedtime as needed for cough.  Dispense: 70 mL; Refill: 0    General Counseling: Jordan Carpenter verbalizes understanding of the findings of todays visit and agrees with plan of treatment. I have discussed any further diagnostic evaluation that may be needed or ordered today. We also reviewed her medications today. she has been encouraged to call the office with any questions or concerns that should arise related to todays  visit.    No orders of the defined types were placed in this encounter.   Meds ordered this encounter  Medications   amoxicillin-clavulanate (AUGMENTIN) 875-125 MG tablet    Sig: Take 1 tablet by mouth 2 (two) times daily for 10 days. Take with food.    Dispense:  20 tablet    Refill:  0    Fill new script today   chlorpheniramine-HYDROcodone (TUSSIONEX) 10-8 MG/5ML    Sig: Take 5 mLs by mouth at bedtime as needed for cough.    Dispense:  70 mL    Refill:  0    Return if symptoms worsen or fail to improve.   Total time spent:20 Minutes Time spent includes review of chart, medications, test results, and follow up plan with the patient.    Controlled Substance Database was reviewed by me.  This patient was seen by Sallyanne Kuster, FNP-C in collaboration with Dr. Beverely Risen as a part of collaborative care agreement.   Hasson Gaspard R. Tedd Sias, MSN, FNP-C Internal medicine

## 2023-02-08 ENCOUNTER — Ambulatory Visit (INDEPENDENT_AMBULATORY_CARE_PROVIDER_SITE_OTHER): Payer: Medicare Other | Admitting: Podiatry

## 2023-02-08 DIAGNOSIS — G5782 Other specified mononeuropathies of left lower limb: Secondary | ICD-10-CM | POA: Diagnosis not present

## 2023-02-09 NOTE — Progress Notes (Signed)
She presents today for a follow-up of her third injection interdigital space of the left foot.  She states that is starting to feel better does not feel quite as full and thick on the bottom as it did previously.  States that is still kind of sore though.  Objective: Vital signs stable oriented x 3 there is no erythema edema salines drainage odor she does have some tenderness on palpation of the third interdigital space of the left foot.  Assessment: Neuroma slowly resolving with dehydrated alcohol injections.  Plan: Discussed etiology pathology and surgical therapies I injected her fourth dose of dehydrated alcohol into the third interdigital space.  I will follow-up with her in 3 weeks

## 2023-02-22 ENCOUNTER — Encounter: Payer: Self-pay | Admitting: Nurse Practitioner

## 2023-02-22 ENCOUNTER — Ambulatory Visit: Payer: Medicare Other | Admitting: Nurse Practitioner

## 2023-02-22 VITALS — BP 134/78 | HR 76 | Temp 98.2°F | Resp 16 | Ht 63.0 in | Wt 177.6 lb

## 2023-02-22 DIAGNOSIS — Z6831 Body mass index (BMI) 31.0-31.9, adult: Secondary | ICD-10-CM | POA: Diagnosis not present

## 2023-02-22 DIAGNOSIS — R052 Subacute cough: Secondary | ICD-10-CM

## 2023-02-22 DIAGNOSIS — R2242 Localized swelling, mass and lump, left lower limb: Secondary | ICD-10-CM | POA: Diagnosis not present

## 2023-02-22 DIAGNOSIS — E6609 Other obesity due to excess calories: Secondary | ICD-10-CM

## 2023-02-22 NOTE — Progress Notes (Unsigned)
[facility] [address] [place], [place] [address]  Internal MEDICINE  Office Visit Note  Patient Name: Jordan Carpenter  [id number]  [id number]  Date of Service: 02/22/2023  Chief Complaint  Patient presents with  . Follow-up    Review U/S     HPI Jordan Carpenter presents for a follow-up visit for cough and ultrasound results.  Still having cough -- after URI. Managing ok on her own Mass of left lower leg -- ultrasound describes the mass as suspicious and recommends ruling out malignant neoplasm with MRI.     Current Medication: Outpatient Encounter Medications as of 02/22/2023  Medication Sig  . calcium carbonate (OS-CAL) 1250 (500 Ca) MG chewable tablet Chew 1 tablet by mouth daily.  . cetirizine (ZYRTEC) 10 MG tablet Take by mouth.  . chlorpheniramine-HYDROcodone (TUSSIONEX) 10-8 MG/5ML Take 5 mLs by mouth at bedtime as needed for cough.  . Cholecalciferol 25 MCG (1000 UT) capsule Take by mouth.  Marland Kitchen CINNAMON PO Take 600 mg by mouth in the morning and at bedtime.  . Coenzyme Q10 400 MG CAPS Take by mouth.  Jordan Carpenter 500 MG CAPS Take by mouth.  . ezetimibe (ZETIA) 10 MG tablet TAKE 1 TABLET DAILY  . famotidine (PEPCID) 20 MG tablet Take 1 tablet (20 mg total) by mouth at bedtime.  . fluticasone (FLONASE) 50 MCG/ACT nasal spray 1-2 spray in each nostril once or twice day  . lisinopril-hydrochlorothiazide (ZESTORETIC) 20-25 MG tablet Take 1 tablet by mouth daily.  . Omega-3 Fatty Acids (FISH OIL CONCENTRATE) 1000 MG CAPS   . pantoprazole (PROTONIX) 40 MG tablet Take 1 tablet (40 mg total) by mouth 2 (two) times daily.  . Phentermine-Topiramate (QSYMIA) 3.75-23 MG CP24 Take 1 capsule by mouth daily before breakfast.  . [START ON 02/27/2023] Phentermine-Topiramate (QSYMIA) 7.5-46 MG CP24 Take 1 capsule by mouth daily before breakfast.  . ranitidine (ZANTAC) 300 MG tablet   . rosuvastatin (CRESTOR) 40 MG tablet TAKE 1 TABLET DAILY  . SUMAtriptan (IMITREX) 20 MG/ACT nasal spray Place  into the nose.  . tirzepatide (ZEPBOUND) 2.5 MG/0.5ML Pen Inject 2.5 mg into the skin once a week.  . [DISCONTINUED] tirzepatide (ZEPBOUND) 5 MG/0.5ML Pen Inject 5 mg into the skin once a week.   No facility-administered encounter medications on file as of 02/22/2023.    Surgical History: Past Surgical History:  Procedure Laterality Date  . BACK SURGERY Left    2004  . BREAST EXCISIONAL BIOPSY Right 1997   neg  . CATARACT EXTRACTION Bilateral   . eye lift Left   . KNEE SURGERY Left    2007 AGAIN  . REPLACEMENT TOTAL KNEE    . TUBAL LIGATION      Medical History: Past Medical History:  Diagnosis Date  . GERD (gastroesophageal reflux disease)   . Hyperlipidemia   . Hypertension     Family History: Family History  Problem Relation Age of Onset  . Lung cancer Mother   . Lymphoma Father   . Lung cancer Brother   . Breast cancer Maternal Aunt 60  . Ovarian cancer Paternal Aunt   . Kidney cancer Maternal Grandmother   . Breast cancer Paternal Grandmother     Social History   Socioeconomic History  . Marital status: Married    Spouse name: Not on file  . Number of children: Not on file  . Years of education: Not on file  . Highest education level: Not on file  Occupational History  . Not on file  Tobacco Use  . Smoking status: Never    Passive exposure: Never  . Smokeless tobacco: Never  Substance and Sexual Activity  . Alcohol use: Yes    Alcohol/week: 1.0 standard drink of alcohol    Types: 1 Glasses of wine per week    Comment: occ.  . Drug use: Never  . Sexual activity: Not on file  Other Topics Concern  . Not on file  Social History Narrative  . Not on file   Social Determinants of Health   Financial Resource Strain: Not on file  Food Insecurity: Not on file  Transportation Needs: Not on file  Physical Activity: Not on file  Stress: Not on file  Social Connections: Not on file  Intimate Partner Violence: Not on file      Review of  Systems  Vital Signs: BP 134/78   Pulse 76   Temp 98.2 F (36.8 C)   Resp 16   Ht 5\' 3"  (1.6 m)   Wt 177 lb 9.6 oz (80.6 kg)   SpO2 98%   BMI 31.46 kg/m    Physical Exam     Assessment/Plan:   General Counseling: Jordan Carpenter verbalizes understanding of the findings of todays visit and agrees with plan of treatment. I have discussed any further diagnostic evaluation that may be needed or ordered today. We also reviewed her medications today. she has been encouraged to call the office with any questions or concerns that should arise related to todays visit.    Orders Placed This Encounter  Procedures  . MR TIBIA FIBULA RIGHT W WO CONTRAST    No orders of the defined types were placed in this encounter.   Return for F/U, Jordan Carpenter PCP MRI of leg and weight loss .   Total time spent:*** Minutes Time spent includes review of chart, medications, test results, and follow up plan with the patient.   [place] Controlled Substance Database was reviewed by me.  This patient was seen by Jordan Kuster, FNP-C in collaboration with Dr. Beverely Carpenter as a part of collaborative care agreement.   Jordan Carpenter R. Tedd Sias, MSN, FNP-C Internal medicine

## 2023-02-27 ENCOUNTER — Ambulatory Visit
Admission: RE | Admit: 2023-02-27 | Discharge: 2023-02-27 | Disposition: A | Discharge status: Home / Self Care | Payer: Medicare Other | Source: Ambulatory Visit | Attending: Nurse Practitioner | Admitting: Nurse Practitioner

## 2023-02-27 DIAGNOSIS — R2242 Localized swelling, mass and lump, left lower limb: Secondary | ICD-10-CM | POA: Insufficient documentation

## 2023-02-27 MED ORDER — DIETHYLPROPION HCL ER 75 MG PO TB24: 75.0000 mg | ORAL_TABLET | Freq: Every day | ORAL | 1 refills | Status: DC

## 2023-02-27 MED ORDER — GADOBUTROL 1 MMOL/ML IV SOLN
7.5000 mL | Freq: Once | INTRAVENOUS | Status: AC | PRN
  Administered 2023-02-27: 7.5 mL via INTRAVENOUS

## 2023-03-01 ENCOUNTER — Encounter: Payer: Self-pay | Admitting: Podiatry

## 2023-03-01 ENCOUNTER — Ambulatory Visit (INDEPENDENT_AMBULATORY_CARE_PROVIDER_SITE_OTHER): Payer: Medicare Other | Admitting: Podiatry

## 2023-03-01 DIAGNOSIS — G5782 Other specified mononeuropathies of left lower limb: Secondary | ICD-10-CM | POA: Diagnosis not present

## 2023-03-01 NOTE — Progress Notes (Signed)
M presents today for follow-up of her neuroma and alcohol injection states this seems to be getting better approximately 75% improved still has a bit of a swollen puffy feeling on the bottom.  Objective: Vital signs stable alert and oriented x 3 there is no erythema edema cellulitis drainage or odor she does have palpable Mulder's click to the third interspace of the left foot but much less tender than it has been previously.  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  Plan: Injected her fifth dose of dehydrated alcohol a day 4% 1.5 cc of 4% dehydrated alcohol was injected to the area.  She tolerated procedure well follow-up with me in 3 weeks

## 2023-03-22 ENCOUNTER — Ambulatory Visit (INDEPENDENT_AMBULATORY_CARE_PROVIDER_SITE_OTHER): Payer: Medicare Other | Admitting: Podiatry

## 2023-03-22 ENCOUNTER — Encounter: Payer: Self-pay | Admitting: Podiatry

## 2023-03-22 DIAGNOSIS — G5782 Other specified mononeuropathies of left lower limb: Secondary | ICD-10-CM

## 2023-03-22 NOTE — Progress Notes (Signed)
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  States that is better some days and some days I do not even feel it.  Objective: Vital signs are stable alert oriented x 3.  Pulses are palpable.  She has some tenderness on palpation to the third interdigital space but all in all seems to be doing pretty well.  Assessment: Resolving neuroma third interspace.  Plan: I injected the third interdigital space today dehydrated alcohol 1.5 mL was utilized 4% dehydrated alcohol.  And I will follow-up with her in 3 weeks if necessary.

## 2023-03-23 ENCOUNTER — Encounter: Payer: Self-pay | Admitting: Nurse Practitioner

## 2023-03-23 ENCOUNTER — Ambulatory Visit (INDEPENDENT_AMBULATORY_CARE_PROVIDER_SITE_OTHER): Payer: Medicare Other | Admitting: Nurse Practitioner

## 2023-03-23 VITALS — BP 110/80 | HR 70 | Temp 98.4°F | Resp 16 | Ht 63.0 in | Wt 173.6 lb

## 2023-03-23 DIAGNOSIS — Z6831 Body mass index (BMI) 31.0-31.9, adult: Secondary | ICD-10-CM

## 2023-03-23 DIAGNOSIS — K219 Gastro-esophageal reflux disease without esophagitis: Secondary | ICD-10-CM | POA: Diagnosis not present

## 2023-03-23 DIAGNOSIS — E6609 Other obesity due to excess calories: Secondary | ICD-10-CM

## 2023-03-23 DIAGNOSIS — E66811 Obesity, class 1: Secondary | ICD-10-CM

## 2023-03-23 DIAGNOSIS — R2242 Localized swelling, mass and lump, left lower limb: Secondary | ICD-10-CM | POA: Diagnosis not present

## 2023-03-23 DIAGNOSIS — R051 Acute cough: Secondary | ICD-10-CM

## 2023-03-23 MED ORDER — DIETHYLPROPION HCL ER 75 MG PO TB24: 75.0000 mg | ORAL_TABLET | Freq: Every day | ORAL | 0 refills | Status: DC

## 2023-03-23 NOTE — Progress Notes (Signed)
[facility] [address] [place], [place] [address]  Internal MEDICINE  Office Visit Note  Patient Name: Jordan Carpenter  [id number]  [id number]  Date of Service: 03/23/2023  Chief Complaint  Patient presents with   Gastroesophageal Reflux   Hypertension   Hyperlipidemia   Follow-up    Weight loss and Mri results.     HPI Jordan Carpenter presents for a follow-up visit for GERD and weight loss  Weight loss -- lost 4 lbs since starting the medication on 9/10. Lost 6 lbs according to scale at home  GERD -- chronic cough, history of esophageal polyps, established at [facility] with dr. Norma Fredrickson.  MRI of right leg -- result confirms a small pocket of fat necrosis with does not require any further follow up    Current Medication: Outpatient Encounter Medications as of 03/23/2023  Medication Sig   calcium carbonate (OS-CAL) 1250 (500 Ca) MG chewable tablet Chew 1 tablet by mouth daily.   cetirizine (ZYRTEC) 10 MG tablet Take by mouth.   chlorpheniramine-HYDROcodone (TUSSIONEX) 10-8 MG/5ML Take 5 mLs by mouth at bedtime as needed for cough.   Cholecalciferol 25 MCG (1000 UT) capsule Take by mouth.   CINNAMON PO Take 600 mg by mouth in the morning and at bedtime.   Coenzyme Q10 400 MG CAPS Take by mouth.   Elderberry 500 MG CAPS Take by mouth.   ezetimibe (ZETIA) 10 MG tablet TAKE 1 TABLET DAILY   famotidine (PEPCID) 20 MG tablet Take 1 tablet (20 mg total) by mouth at bedtime.   fluticasone (FLONASE) 50 MCG/ACT nasal spray 1-2 spray in each nostril once or twice day   lisinopril-hydrochlorothiazide (ZESTORETIC) 20-25 MG tablet Take 1 tablet by mouth daily.   Omega-3 Fatty Acids (FISH OIL CONCENTRATE) 1000 MG CAPS    pantoprazole (PROTONIX) 40 MG tablet Take 1 tablet (40 mg total) by mouth 2 (two) times daily.   ranitidine (ZANTAC) 300 MG tablet    rosuvastatin (CRESTOR) 40 MG tablet TAKE 1 TABLET DAILY   SUMAtriptan (IMITREX) 20 MG/ACT nasal spray Place into the nose.   tirzepatide (ZEPBOUND)  2.5 MG/0.5ML Pen Inject 2.5 mg into the skin once a week.   [DISCONTINUED] Diethylpropion HCl CR 75 MG TB24 Take 1 tablet (75 mg total) by mouth daily before breakfast.   Diethylpropion HCl CR 75 MG TB24 Take 1 tablet (75 mg total) by mouth daily before breakfast.   No facility-administered encounter medications on file as of 03/23/2023.    Surgical History: Past Surgical History:  Procedure Laterality Date   BACK SURGERY Left    2004   BREAST EXCISIONAL BIOPSY Right 1997   neg   CATARACT EXTRACTION Bilateral    eye lift Left    KNEE SURGERY Left    2007 AGAIN   REPLACEMENT TOTAL KNEE     TUBAL LIGATION      Medical History: Past Medical History:  Diagnosis Date   GERD (gastroesophageal reflux disease)    Hyperlipidemia    Hypertension     Family History: Family History  Problem Relation Age of Onset   Lung cancer Mother    Lymphoma Father    Lung cancer Brother    Breast cancer Maternal Aunt 60   Ovarian cancer Paternal Aunt    Kidney cancer Maternal Grandmother    Breast cancer Paternal Grandmother     Social History   Socioeconomic History   Marital status: Married    Spouse name: Not on file   Number of children:  Not on file   Years of education: Not on file   Highest education level: Not on file  Occupational History   Not on file  Tobacco Use   Smoking status: Never    Passive exposure: Never   Smokeless tobacco: Never  Substance and Sexual Activity   Alcohol use: Yes    Alcohol/week: 1.0 standard drink of alcohol    Types: 1 Glasses of wine per week    Comment: occ.   Drug use: Never   Sexual activity: Not on file  Other Topics Concern   Not on file  Social History Narrative   Not on file   Social Determinants of Health   Financial Resource Strain: Not on file  Food Insecurity: Not on file  Transportation Needs: Not on file  Physical Activity: Not on file  Stress: Not on file  Social Connections: Not on file  Intimate Partner Violence:  Not on file      Review of Systems  Constitutional: Negative.  Negative for fatigue.  HENT: Negative.  Negative for congestion.   Respiratory:  Positive for cough. Negative for chest tightness, shortness of breath and wheezing.   Cardiovascular: Negative.  Negative for chest pain and palpitations.  Genitourinary: Negative.   Musculoskeletal: Negative.   Neurological: Negative.     Vital Signs: BP 110/80   Pulse 70   Temp 98.4 F (36.9 C)   Resp 16   Ht 5\' 3"  (1.6 m)   Wt 173 lb 9.6 oz (78.7 kg)   SpO2 96%   BMI 30.75 kg/m    Physical Exam Vitals reviewed.  Constitutional:      General: She is not in acute distress.    Appearance: Normal appearance. She is obese. She is not ill-appearing.  HENT:     Head: Normocephalic and atraumatic.  Eyes:     Pupils: Pupils are equal, round, and reactive to light.  Cardiovascular:     Rate and Rhythm: Normal rate and regular rhythm.  Pulmonary:     Effort: Pulmonary effort is normal. No respiratory distress.  Skin:    Comments: Small lump/mass under the skin on the medial lower right leg just below the knee. Nonpainful, nontender.   Neurological:     Mental Status: She is alert and oriented to person, place, and time.  Psychiatric:        Mood and Affect: Mood normal.        Behavior: Behavior normal.        Assessment/Plan: 1. Gastroesophageal reflux disease without esophagitis Continue medication as prescribed, call [facility] if further issues, patient already established with them. Avoid triggers for acid reflux  2. Acute cough Due to GERD   3. Subcutaneous mass of left lower leg Confirmed as a fat necrosis, no follow up needed   4. Class 1 obesity due to excess calories with serious comorbidity and body mass index (BMI) of 31.0 to 31.9 in adult Diethylpropion prescribed, follow up in 1 month   General Counseling: Jordan Carpenter verbalizes understanding of the findings of todays visit and agrees with plan of treatment. I  have discussed any further diagnostic evaluation that may be needed or ordered today. We also reviewed her medications today. she has been encouraged to call the office with any questions or concerns that should arise related to todays visit.    No orders of the defined types were placed in this encounter.   Meds ordered this encounter  Medications   Diethylpropion HCl CR 75  MG TB24    Sig: Take 1 tablet (75 mg total) by mouth daily before breakfast.    Dispense:  30 tablet    Refill:  0    Add refill to file, Dx code E66.09; BMI is 31.46    Return in about 8 weeks (around 05/18/2023) for F/U, Weight loss, Misheel Gowans PCP.   Total time spent:30 Minutes Time spent includes review of chart, medications, test results, and follow up plan with the patient.   [place] Controlled Substance Database was reviewed by me.  This patient was seen by Sallyanne Kuster, FNP-C in collaboration with Dr. Beverely Risen as a part of collaborative care agreement.   Holly Iannaccone R. Tedd Sias, MSN, FNP-C Internal medicine

## 2023-04-02 ENCOUNTER — Encounter: Payer: Self-pay | Admitting: Nurse Practitioner

## 2023-04-28 ENCOUNTER — Other Ambulatory Visit: Payer: Self-pay | Admitting: Nurse Practitioner

## 2023-04-28 DIAGNOSIS — E66811 Obesity, class 1: Secondary | ICD-10-CM

## 2023-05-01 ENCOUNTER — Encounter: Payer: Self-pay | Admitting: Nurse Practitioner

## 2023-05-01 ENCOUNTER — Other Ambulatory Visit: Payer: Self-pay | Admitting: Internal Medicine

## 2023-05-03 ENCOUNTER — Encounter: Payer: Self-pay | Admitting: Nurse Practitioner

## 2023-05-03 ENCOUNTER — Ambulatory Visit (INDEPENDENT_AMBULATORY_CARE_PROVIDER_SITE_OTHER): Payer: Medicare Other | Admitting: Nurse Practitioner

## 2023-05-03 VITALS — BP 118/80 | HR 72 | Temp 98.3°F | Resp 16 | Ht 63.0 in | Wt 167.0 lb

## 2023-05-03 DIAGNOSIS — E66811 Obesity, class 1: Secondary | ICD-10-CM

## 2023-05-03 DIAGNOSIS — Z6831 Body mass index (BMI) 31.0-31.9, adult: Secondary | ICD-10-CM | POA: Diagnosis not present

## 2023-05-03 DIAGNOSIS — E6609 Other obesity due to excess calories: Secondary | ICD-10-CM

## 2023-05-03 DIAGNOSIS — K219 Gastro-esophageal reflux disease without esophagitis: Secondary | ICD-10-CM | POA: Diagnosis not present

## 2023-05-03 MED ORDER — DIETHYLPROPION HCL ER 75 MG PO TB24: 75.0000 mg | ORAL_TABLET | Freq: Every day | ORAL | 0 refills | Status: DC

## 2023-05-03 NOTE — Progress Notes (Signed)
[facility] [address] [place], [place] [address]  Internal MEDICINE  Office Visit Note  Patient Name: Jordan Carpenter  [id number]  [id number]  Date of Service: 05/03/2023  Chief Complaint  Patient presents with   Gastroesophageal Reflux   Hypertension   Hyperlipidemia   Follow-up    Weight loss issue     HPI Jordan Carpenter presents for a follow-up visit for GERD and weight loss.  Weight loss -- has been taking diethylpropion. Wants to continue this medication for now.  Home scale weight 167 lbs, lost  6 lbs since last office visit.  GERD-- currently taking pantoprazole and famotidine    Current Medication: Outpatient Encounter Medications as of 05/03/2023  Medication Sig   calcium carbonate (OS-CAL) 1250 (500 Ca) MG chewable tablet Chew 1 tablet by mouth daily.   cetirizine (ZYRTEC) 10 MG tablet Take by mouth.   chlorpheniramine-HYDROcodone (TUSSIONEX) 10-8 MG/5ML Take 5 mLs by mouth at bedtime as needed for cough.   Cholecalciferol 25 MCG (1000 UT) capsule Take by mouth.   CINNAMON PO Take 600 mg by mouth in the morning and at bedtime.   Coenzyme Q10 400 MG CAPS Take by mouth.   Elderberry 500 MG CAPS Take by mouth.   ezetimibe (ZETIA) 10 MG tablet TAKE 1 TABLET DAILY   famotidine (PEPCID) 20 MG tablet Take 1 tablet (20 mg total) by mouth at bedtime.   fluticasone (FLONASE) 50 MCG/ACT nasal spray 1-2 spray in each nostril once or twice day   lisinopril-hydrochlorothiazide (ZESTORETIC) 20-25 MG tablet Take 1 tablet by mouth daily.   Omega-3 Fatty Acids (FISH OIL CONCENTRATE) 1000 MG CAPS    pantoprazole (PROTONIX) 40 MG tablet Take 1 tablet (40 mg total) by mouth 2 (two) times daily.   ranitidine (ZANTAC) 300 MG tablet    rosuvastatin (CRESTOR) 40 MG tablet TAKE 1 TABLET DAILY   SUMAtriptan (IMITREX) 20 MG/ACT nasal spray Place into the nose.   tirzepatide (ZEPBOUND) 2.5 MG/0.5ML Pen Inject 2.5 mg into the skin once a week.   [DISCONTINUED] Diethylpropion HCl CR 75 MG  TB24 Take 1 tablet (75 mg total) by mouth daily before breakfast.   Diethylpropion HCl CR 75 MG TB24 Take 1 tablet (75 mg total) by mouth daily before breakfast.   No facility-administered encounter medications on file as of 05/03/2023.    Surgical History: Past Surgical History:  Procedure Laterality Date   BACK SURGERY Left    2004   BREAST EXCISIONAL BIOPSY Right 1997   neg   CATARACT EXTRACTION Bilateral    eye lift Left    KNEE SURGERY Left    2007 AGAIN   REPLACEMENT TOTAL KNEE     TUBAL LIGATION      Medical History: Past Medical History:  Diagnosis Date   GERD (gastroesophageal reflux disease)    Hyperlipidemia    Hypertension     Family History: Family History  Problem Relation Age of Onset   Lung cancer Mother    Lymphoma Father    Lung cancer Brother    Breast cancer Maternal Aunt 60   Ovarian cancer Paternal Aunt    Kidney cancer Maternal Grandmother    Breast cancer Paternal Grandmother     Social History   Socioeconomic History   Marital status: Married    Spouse name: Not on file   Number of children: Not on file   Years of education: Not on file   Highest education level: Not on file  Occupational History  Not on file  Tobacco Use   Smoking status: Never    Passive exposure: Never   Smokeless tobacco: Never  Substance and Sexual Activity   Alcohol use: Yes    Alcohol/week: 1.0 standard drink of alcohol    Types: 1 Glasses of wine per week    Comment: occ.   Drug use: Never   Sexual activity: Not on file  Other Topics Concern   Not on file  Social History Narrative   Not on file   Social Determinants of Health   Financial Resource Strain: Not on file  Food Insecurity: Not on file  Transportation Needs: Not on file  Physical Activity: Not on file  Stress: Not on file  Social Connections: Not on file  Intimate Partner Violence: Not on file      Review of Systems  Constitutional: Negative.  Negative for fatigue.  HENT:  Negative.  Negative for congestion.   Respiratory:  Positive for cough. Negative for chest tightness, shortness of breath and wheezing.   Cardiovascular: Negative.  Negative for chest pain and palpitations.  Genitourinary: Negative.   Musculoskeletal: Negative.   Neurological: Negative.     Vital Signs: BP 118/80   Pulse 72   Temp 98.3 F (36.8 C)   Resp 16   Ht 5\' 3"  (1.6 m)   Wt 167 lb (75.8 kg) Comment: 171 on clinic scale  SpO2 99%   BMI 29.58 kg/m    Physical Exam Vitals reviewed.  Constitutional:      General: She is not in acute distress.    Appearance: Normal appearance. She is obese. She is not ill-appearing.  HENT:     Head: Normocephalic and atraumatic.  Eyes:     Pupils: Pupils are equal, round, and reactive to light.  Cardiovascular:     Rate and Rhythm: Normal rate and regular rhythm.  Pulmonary:     Effort: Pulmonary effort is normal. No respiratory distress.  Skin:    Comments: Small lump/mass under the skin on the medial lower right leg just below the knee. Nonpainful, nontender.   Neurological:     Mental Status: She is alert and oriented to person, place, and time.  Psychiatric:        Mood and Affect: Mood normal.        Behavior: Behavior normal.        Assessment/Plan: 1. Gastroesophageal reflux disease without esophagitis (Primary) Continue pantoprazole and famotidine as prescribed.   2. Class 1 obesity due to excess calories with serious comorbidity and body mass index (BMI) of 31.0 to 31.9 in adult Continue diethylpropion as prescribed, follow up in 8 weeks for weigh in   General Counseling: Jordan Carpenter understanding of the findings of todays visit and agrees with plan of treatment. I have discussed any further diagnostic evaluation that may be needed or ordered today. We also reviewed her medications today. she has been encouraged to call the office with any questions or concerns that should arise related to todays visit.    No  orders of the defined types were placed in this encounter.   Meds ordered this encounter  Medications   Diethylpropion HCl CR 75 MG TB24    Sig: Take 1 tablet (75 mg total) by mouth daily before breakfast.    Dispense:  30 tablet    Refill:  0    Add refill to file, Dx code E66.09; BMI is 31.46    Return in about 4 weeks (around 05/31/2023) for F/U,  Weight loss, Ara Grandmaison PCP.   Total time spent:30 Minutes Time spent includes review of chart, medications, test results, and follow up plan with the patient.   [place] Controlled Substance Database was reviewed by me.  This patient was seen by Sallyanne Kuster, FNP-C in collaboration with Dr. Beverely Risen as a part of collaborative care agreement.   Jandiel Magallanes R. Tedd Sias, MSN, FNP-C Internal medicine

## 2023-05-12 ENCOUNTER — Encounter: Payer: Self-pay | Admitting: Nurse Practitioner

## 2023-05-15 ENCOUNTER — Encounter: Payer: Self-pay | Admitting: Nurse Practitioner

## 2023-05-15 ENCOUNTER — Telehealth (INDEPENDENT_AMBULATORY_CARE_PROVIDER_SITE_OTHER): Payer: Medicare Other | Admitting: Nurse Practitioner

## 2023-05-15 VITALS — Resp 16 | Ht 63.0 in | Wt 166.0 lb

## 2023-05-15 DIAGNOSIS — R051 Acute cough: Secondary | ICD-10-CM | POA: Diagnosis not present

## 2023-05-15 DIAGNOSIS — J0100 Acute maxillary sinusitis, unspecified: Secondary | ICD-10-CM

## 2023-05-15 MED ORDER — HYDROCOD POLI-CHLORPHE POLI ER 10-8 MG/5ML PO SUER: 5.0000 mL | Freq: Every evening | ORAL | 0 refills | Status: DC | PRN

## 2023-05-15 MED ORDER — AMOXICILLIN-POT CLAVULANATE 875-125 MG PO TABS
1.0000 | ORAL_TABLET | Freq: Two times a day (BID) | ORAL | 0 refills | Status: AC
Start: 2023-05-15 — End: 2023-05-25

## 2023-05-15 NOTE — Progress Notes (Signed)
[facility] [address] [place], [place] [address]  Internal MEDICINE  Telephone Visit  Patient Name: Jordan Carpenter  [id number]  [id number]  Date of Service: 05/15/2023  I connected with the patient at 1120 by telephone and verified the patients identity using two identifiers.   I discussed the limitations, risks, security and privacy concerns of performing an evaluation and management service by telephone and the availability of in person appointments. I also discussed with the patient that there may be a patient responsible charge related to the service.  The patient expressed understanding and agrees to proceed.    Chief Complaint  Patient presents with   Telephone Screen    Poss sinus infection, bad sore throat Thursday, coughing. Covid test negative   Telephone Assessment    HPI Jordan Carpenter presents for a telehealth virtual visit for symptoms of sinusitis. Reports runny nose, green sputum, coughing, sore throat, nasal congestion, fatigue, body aches Denies any fever or chills.  Negative for covid  Current Medication: Outpatient Encounter Medications as of 05/15/2023  Medication Sig   amoxicillin-clavulanate (AUGMENTIN) 875-125 MG tablet Take 1 tablet by mouth 2 (two) times daily for 10 days. Take with food   calcium carbonate (OS-CAL) 1250 (500 Ca) MG chewable tablet Chew 1 tablet by mouth daily.   cetirizine (ZYRTEC) 10 MG tablet Take by mouth.   Cholecalciferol 25 MCG (1000 UT) capsule Take by mouth.   CINNAMON PO Take 600 mg by mouth in the morning and at bedtime.   Coenzyme Q10 400 MG CAPS Take by mouth.   Diethylpropion HCl CR 75 MG TB24 Take 1 tablet (75 mg total) by mouth daily before breakfast.   Elderberry 500 MG CAPS Take by mouth.   ezetimibe (ZETIA) 10 MG tablet TAKE 1 TABLET DAILY   famotidine (PEPCID) 20 MG tablet Take 1 tablet (20 mg total) by mouth at bedtime.   fluticasone (FLONASE) 50 MCG/ACT nasal spray 1-2 spray in each nostril once or twice day    lisinopril-hydrochlorothiazide (ZESTORETIC) 20-25 MG tablet Take 1 tablet by mouth daily.   Omega-3 Fatty Acids (FISH OIL CONCENTRATE) 1000 MG CAPS    pantoprazole (PROTONIX) 40 MG tablet Take 1 tablet (40 mg total) by mouth 2 (two) times daily.   ranitidine (ZANTAC) 300 MG tablet    rosuvastatin (CRESTOR) 40 MG tablet TAKE 1 TABLET DAILY   SUMAtriptan (IMITREX) 20 MG/ACT nasal spray Place into the nose.   tirzepatide (ZEPBOUND) 2.5 MG/0.5ML Pen Inject 2.5 mg into the skin once a week.   [DISCONTINUED] chlorpheniramine-HYDROcodone (TUSSIONEX) 10-8 MG/5ML Take 5 mLs by mouth at bedtime as needed for cough.   chlorpheniramine-HYDROcodone (TUSSIONEX) 10-8 MG/5ML Take 5 mLs by mouth at bedtime as needed for cough.   No facility-administered encounter medications on file as of 05/15/2023.    Surgical History: Past Surgical History:  Procedure Laterality Date   BACK SURGERY Left    2004   BREAST EXCISIONAL BIOPSY Right 1997   neg   CATARACT EXTRACTION Bilateral    eye lift Left    KNEE SURGERY Left    2007 AGAIN   REPLACEMENT TOTAL KNEE     TUBAL LIGATION      Medical History: Past Medical History:  Diagnosis Date   GERD (gastroesophageal reflux disease)    Hyperlipidemia    Hypertension     Family History: Family History  Problem Relation Age of Onset   Lung cancer Mother    Lymphoma Father    Lung cancer Brother  Breast cancer Maternal Aunt 60   Ovarian cancer Paternal Aunt    Kidney cancer Maternal Grandmother    Breast cancer Paternal Grandmother     Social History   Socioeconomic History   Marital status: Married    Spouse name: Not on file   Number of children: Not on file   Years of education: Not on file   Highest education level: Not on file  Occupational History   Not on file  Tobacco Use   Smoking status: Never    Passive exposure: Never   Smokeless tobacco: Never  Substance and Sexual Activity   Alcohol use: Yes    Alcohol/week: 1.0 standard  drink of alcohol    Types: 1 Glasses of wine per week    Comment: occ.   Drug use: Never   Sexual activity: Not on file  Other Topics Concern   Not on file  Social History Narrative   Not on file   Social Determinants of Health   Financial Resource Strain: Not on file  Food Insecurity: Not on file  Transportation Needs: Not on file  Physical Activity: Not on file  Stress: Not on file  Social Connections: Not on file  Intimate Partner Violence: Not on file      Review of Systems  Constitutional:  Positive for fatigue. Negative for appetite change, chills and fever.  HENT:  Positive for congestion, postnasal drip, rhinorrhea, sinus pressure, sinus pain and sore throat.   Respiratory:  Positive for cough. Negative for chest tightness, shortness of breath and wheezing.   Gastrointestinal: Negative.   Musculoskeletal:  Positive for myalgias (body aches).  Neurological:  Positive for headaches.    Vital Signs: Resp 16   Ht 5\' 3"  (1.6 m)   Wt 166 lb (75.3 kg)   BMI 29.41 kg/m    Observation/Objective: She is alert and oriented. No acute distress noted.     Assessment/Plan: 1. Acute non-recurrent maxillary sinusitis Antibiotic and cough medication prescribed. Take antibiotic until gone  - amoxicillin-clavulanate (AUGMENTIN) 875-125 MG tablet; Take 1 tablet by mouth 2 (two) times daily for 10 days. Take with food  Dispense: 20 tablet; Refill: 0 - chlorpheniramine-HYDROcodone (TUSSIONEX) 10-8 MG/5ML; Take 5 mLs by mouth at bedtime as needed for cough.  Dispense: 70 mL; Refill: 0  2. Acute cough Cough medication prescribed as needed  - chlorpheniramine-HYDROcodone (TUSSIONEX) 10-8 MG/5ML; Take 5 mLs by mouth at bedtime as needed for cough.  Dispense: 70 mL; Refill: 0   General Counseling: Jordan Carpenter verbalizes understanding of the findings of today's phone visit and agrees with plan of treatment. I have discussed any further diagnostic evaluation that may be needed or ordered  today. We also reviewed her medications today. she has been encouraged to call the office with any questions or concerns that should arise related to todays visit.  Return if symptoms worsen or fail to improve.   No orders of the defined types were placed in this encounter.   Meds ordered this encounter  Medications   amoxicillin-clavulanate (AUGMENTIN) 875-125 MG tablet    Sig: Take 1 tablet by mouth 2 (two) times daily for 10 days. Take with food    Dispense:  20 tablet    Refill:  0    Fill script today   chlorpheniramine-HYDROcodone (TUSSIONEX) 10-8 MG/5ML    Sig: Take 5 mLs by mouth at bedtime as needed for cough.    Dispense:  70 mL    Refill:  0    Fill  script today    Time spent:10 Minutes Time spent with patient included reviewing progress notes, labs, imaging studies, and discussing plan for follow up.  [place] Controlled Substance Database was reviewed by me for overdose risk score (ORS) if appropriate.  This patient was seen by Sallyanne Kuster, FNP-C in collaboration with Dr. Beverely Risen as a part of collaborative care agreement.  Jordan Oliva R. Tedd Sias, MSN, FNP-C Internal medicine

## 2023-05-16 ENCOUNTER — Ambulatory Visit: Payer: Medicare Other | Admitting: Nurse Practitioner

## 2023-05-23 ENCOUNTER — Ambulatory Visit: Payer: Medicare Other | Admitting: Nurse Practitioner

## 2023-05-24 ENCOUNTER — Other Ambulatory Visit: Payer: Self-pay | Admitting: Nurse Practitioner

## 2023-05-24 DIAGNOSIS — Z0001 Encounter for general adult medical examination with abnormal findings: Secondary | ICD-10-CM

## 2023-05-31 ENCOUNTER — Encounter: Payer: Self-pay | Admitting: Nurse Practitioner

## 2023-05-31 ENCOUNTER — Ambulatory Visit (INDEPENDENT_AMBULATORY_CARE_PROVIDER_SITE_OTHER): Payer: Medicare Other | Admitting: Nurse Practitioner

## 2023-05-31 VITALS — BP 120/74 | HR 73 | Temp 98.6°F | Resp 16 | Ht 63.0 in | Wt 166.2 lb

## 2023-05-31 DIAGNOSIS — E6609 Other obesity due to excess calories: Secondary | ICD-10-CM

## 2023-05-31 DIAGNOSIS — K219 Gastro-esophageal reflux disease without esophagitis: Secondary | ICD-10-CM

## 2023-05-31 DIAGNOSIS — E66811 Obesity, class 1: Secondary | ICD-10-CM

## 2023-05-31 DIAGNOSIS — Z6831 Body mass index (BMI) 31.0-31.9, adult: Secondary | ICD-10-CM

## 2023-05-31 MED ORDER — CONTRAVE 8-90 MG PO TB12: 2.0000 | ORAL_TABLET | Freq: Two times a day (BID) | ORAL | 1 refills | Status: DC

## 2023-05-31 MED ORDER — PANTOPRAZOLE SODIUM 40 MG PO TBEC
40.0000 mg | DELAYED_RELEASE_TABLET | Freq: Three times a day (TID) | ORAL | 3 refills | Status: DC
Start: 2023-05-31 — End: 2023-08-02

## 2023-05-31 NOTE — Progress Notes (Signed)
[facility] [address] [place], [place] [address]  Internal MEDICINE  Office Visit Note  Patient Name: Jordan Carpenter  [id number]  [id number]  Date of Service: 05/31/2023  Chief Complaint  Patient presents with   Gastroesophageal Reflux   Hypertension   Hyperlipidemia   Follow-up    Weight loss     HPI Jordan Carpenter presents for a follow-up visit for GERD and weight loss.  Weight loss -- lost 2 lbs since last office visit. Interested in trying a different medication.  GERD -- doing well with taking pantoprazole 3 times daily. Symptoms are well controlled.    Current Medication: Outpatient Encounter Medications as of 05/31/2023  Medication Sig   calcium carbonate (OS-CAL) 1250 (500 Ca) MG chewable tablet Chew 1 tablet by mouth daily.   cetirizine (ZYRTEC) 10 MG tablet Take by mouth.   Cholecalciferol 25 MCG (1000 UT) capsule Take by mouth.   CINNAMON PO Take 600 mg by mouth in the morning and at bedtime.   Coenzyme Q10 400 MG CAPS Take by mouth.   Diethylpropion HCl CR 75 MG TB24 Take 1 tablet (75 mg total) by mouth daily before breakfast.   Elderberry 500 MG CAPS Take by mouth.   ezetimibe (ZETIA) 10 MG tablet TAKE 1 TABLET DAILY   famotidine (PEPCID) 20 MG tablet Take 1 tablet (20 mg total) by mouth at bedtime.   fluticasone (FLONASE) 50 MCG/ACT nasal spray 1-2 spray in each nostril once or twice day   lisinopril-hydrochlorothiazide (ZESTORETIC) 20-25 MG tablet Take 1 tablet by mouth daily.   Naltrexone-buPROPion HCl ER (CONTRAVE) 8-90 MG TB12 Take 2 tablets by mouth in the morning and at bedtime.   Omega-3 Fatty Acids (FISH OIL CONCENTRATE) 1000 MG CAPS    ranitidine (ZANTAC) 300 MG tablet    rosuvastatin (CRESTOR) 40 MG tablet TAKE 1 TABLET DAILY   SUMAtriptan (IMITREX) 20 MG/ACT nasal spray Place into the nose.   tirzepatide (ZEPBOUND) 2.5 MG/0.5ML Pen Inject 2.5 mg into the skin once a week.   [DISCONTINUED] chlorpheniramine-HYDROcodone (TUSSIONEX) 10-8 MG/5ML Take 5  mLs by mouth at bedtime as needed for cough.   [DISCONTINUED] pantoprazole (PROTONIX) 40 MG tablet TAKE 1 TABLET TWICE A DAY   pantoprazole (PROTONIX) 40 MG tablet Take 1 tablet (40 mg total) by mouth 3 (three) times daily.   No facility-administered encounter medications on file as of 05/31/2023.    Surgical History: Past Surgical History:  Procedure Laterality Date   BACK SURGERY Left    2004   BREAST EXCISIONAL BIOPSY Right 1997   neg   CATARACT EXTRACTION Bilateral    eye lift Left    KNEE SURGERY Left    2007 AGAIN   REPLACEMENT TOTAL KNEE     TUBAL LIGATION      Medical History: Past Medical History:  Diagnosis Date   GERD (gastroesophageal reflux disease)    Hyperlipidemia    Hypertension     Family History: Family History  Problem Relation Age of Onset   Lung cancer Mother    Lymphoma Father    Lung cancer Brother    Breast cancer Maternal Aunt 60   Ovarian cancer Paternal Aunt    Kidney cancer Maternal Grandmother    Breast cancer Paternal Grandmother     Social History   Socioeconomic History   Marital status: Married    Spouse name: Not on file   Number of children: Not on file   Years of education: Not on file   Highest  education level: Not on file  Occupational History   Not on file  Tobacco Use   Smoking status: Never    Passive exposure: Never   Smokeless tobacco: Never  Substance and Sexual Activity   Alcohol use: Yes    Alcohol/week: 1.0 standard drink of alcohol    Types: 1 Glasses of wine per week    Comment: occ.   Drug use: Never   Sexual activity: Not on file  Other Topics Concern   Not on file  Social History Narrative   Not on file   Social Determinants of Health   Financial Resource Strain: Not on file  Food Insecurity: Not on file  Transportation Needs: Not on file  Physical Activity: Not on file  Stress: Not on file  Social Connections: Not on file  Intimate Partner Violence: Not on file      Review of Systems   Constitutional: Negative.  Negative for fatigue.  HENT: Negative.  Negative for congestion.   Respiratory:  Positive for cough. Negative for chest tightness, shortness of breath and wheezing.   Cardiovascular: Negative.  Negative for chest pain and palpitations.  Genitourinary: Negative.   Musculoskeletal: Negative.   Neurological: Negative.     Vital Signs: BP 120/74   Pulse 73   Temp 98.6 F (37 C)   Resp 16   Ht 5\' 3"  (1.6 m)   Wt 166 lb 3.2 oz (75.4 kg)   SpO2 96%   BMI 29.44 kg/m    Physical Exam Vitals reviewed.  Constitutional:      General: She is not in acute distress.    Appearance: Normal appearance. She is obese. She is not ill-appearing.  HENT:     Head: Normocephalic and atraumatic.  Eyes:     Pupils: Pupils are equal, round, and reactive to light.  Cardiovascular:     Rate and Rhythm: Normal rate and regular rhythm.  Pulmonary:     Effort: Pulmonary effort is normal. No respiratory distress.  Neurological:     Mental Status: She is alert and oriented to person, place, and time.  Psychiatric:        Mood and Affect: Mood normal.        Behavior: Behavior normal.        Assessment/Plan: 1. Gastroesophageal reflux disease without esophagitis Continue pantoprazole as prescribed.  - pantoprazole (PROTONIX) 40 MG tablet; Take 1 tablet (40 mg total) by mouth 3 (three) times daily.  Dispense: 270 tablet; Refill: 3  2. Class 1 obesity due to excess calories with serious comorbidity and body mass index (BMI) of 31.0 to 31.9 in adult Discontinue diethylpropion. Start contrave as prescribed. Sample given to patient.  - Naltrexone-buPROPion HCl ER (CONTRAVE) 8-90 MG TB12; Take 2 tablets by mouth in the morning and at bedtime.  Dispense: 120 tablet; Refill: 1   General Counseling: Jordan Carpenter verbalizes understanding of the findings of todays visit and agrees with plan of treatment. I have discussed any further diagnostic evaluation that may be needed or ordered  today. We also reviewed her medications today. she has been encouraged to call the office with any questions or concerns that should arise related to todays visit.    No orders of the defined types were placed in this encounter.   Meds ordered this encounter  Medications   Naltrexone-buPROPion HCl ER (CONTRAVE) 8-90 MG TB12    Sig: Take 2 tablets by mouth in the morning and at bedtime.    Dispense:  120 tablet  Refill:  1    Dx code E66.01 BMI 29.44.   pantoprazole (PROTONIX) 40 MG tablet    Sig: Take 1 tablet (40 mg total) by mouth 3 (three) times daily.    Dispense:  270 tablet    Refill:  3    Return for previously scheduled, AWV, Elianis Fischbach PCP in february 2025.   Total time spent:30 Minutes Time spent includes review of chart, medications, test results, and follow up plan with the patient.   [place] Controlled Substance Database was reviewed by me.  This patient was seen by Sallyanne Kuster, FNP-C in collaboration with Dr. Beverely Risen as a part of collaborative care agreement.   Maddux Vanscyoc R. Tedd Sias, MSN, FNP-C Internal medicine

## 2023-06-01 ENCOUNTER — Encounter: Payer: Self-pay | Admitting: Nurse Practitioner

## 2023-06-20 ENCOUNTER — Telehealth: Payer: Self-pay

## 2023-06-20 NOTE — Telephone Encounter (Addendum)
 Completed new P.A. for patient's Contrave. Re-sent P.A. again on 06/29/2022.

## 2023-06-26 ENCOUNTER — Encounter: Payer: Self-pay | Admitting: Nurse Practitioner

## 2023-06-26 DIAGNOSIS — E6609 Other obesity due to excess calories: Secondary | ICD-10-CM

## 2023-07-03 ENCOUNTER — Other Ambulatory Visit: Payer: Self-pay | Admitting: Nurse Practitioner

## 2023-07-03 DIAGNOSIS — E782 Mixed hyperlipidemia: Secondary | ICD-10-CM

## 2023-07-05 MED ORDER — DIETHYLPROPION HCL ER 75 MG PO TB24: 75.0000 mg | ORAL_TABLET | Freq: Every day | ORAL | 0 refills | Status: DC

## 2023-07-12 ENCOUNTER — Ambulatory Visit: Payer: Medicare Other | Admitting: Podiatry

## 2023-07-22 ENCOUNTER — Encounter: Payer: Self-pay | Admitting: Nurse Practitioner

## 2023-07-22 LAB — CBC WITH DIFFERENTIAL/PLATELET
Basophils Absolute: 0.1 10*3/uL (ref 0.0–0.2)
Basos: 1 %
EOS (ABSOLUTE): 0.1 10*3/uL (ref 0.0–0.4)
Eos: 1 %
Hematocrit: 43.6 % (ref 34.0–46.6)
Hemoglobin: 14.2 g/dL (ref 11.1–15.9)
Immature Grans (Abs): 0 10*3/uL (ref 0.0–0.1)
Immature Granulocytes: 0 %
Lymphocytes Absolute: 1.5 10*3/uL (ref 0.7–3.1)
Lymphs: 23 %
MCH: 28.9 pg (ref 26.6–33.0)
MCHC: 32.6 g/dL (ref 31.5–35.7)
MCV: 89 fL (ref 79–97)
Monocytes Absolute: 0.5 10*3/uL (ref 0.1–0.9)
Monocytes: 7 %
Neutrophils Absolute: 4.7 10*3/uL (ref 1.4–7.0)
Neutrophils: 68 %
Platelets: 235 10*3/uL (ref 150–450)
RBC: 4.92 x10E6/uL (ref 3.77–5.28)
RDW: 13.1 % (ref 11.7–15.4)
WBC: 6.8 10*3/uL (ref 3.4–10.8)

## 2023-07-22 LAB — CMP14+EGFR
ALT: 18 [IU]/L (ref 0–32)
AST: 25 [IU]/L (ref 0–40)
Albumin: 4.1 g/dL (ref 3.8–4.8)
Alkaline Phosphatase: 68 [IU]/L (ref 44–121)
BUN/Creatinine Ratio: 18 (ref 12–28)
BUN: 13 mg/dL (ref 8–27)
Bilirubin Total: 0.7 mg/dL (ref 0.0–1.2)
CO2: 25 mmol/L (ref 20–29)
Calcium: 9.7 mg/dL (ref 8.7–10.3)
Chloride: 98 mmol/L (ref 96–106)
Creatinine, Ser: 0.71 mg/dL (ref 0.57–1.00)
Globulin, Total: 2.4 g/dL (ref 1.5–4.5)
Glucose: 95 mg/dL (ref 70–99)
Potassium: 4 mmol/L (ref 3.5–5.2)
Sodium: 137 mmol/L (ref 134–144)
Total Protein: 6.5 g/dL (ref 6.0–8.5)
eGFR: 89 mL/min/{1.73_m2} (ref >59)

## 2023-07-22 LAB — LIPID PANEL
Chol/HDL Ratio: 2.3 {ratio} (ref 0.0–4.4)
Cholesterol, Total: 192 mg/dL (ref 100–199)
HDL: 83 mg/dL (ref >39)
LDL Chol Calc (NIH): 95 mg/dL (ref 0–99)
Triglycerides: 79 mg/dL (ref 0–149)
VLDL Cholesterol Cal: 14 mg/dL (ref 5–40)

## 2023-07-22 LAB — VITAMIN D 25 HYDROXY (VIT D DEFICIENCY, FRACTURES): Vit D, 25-Hydroxy: 111 ng/mL — ABNORMAL HIGH (ref 30.0–100.0)

## 2023-07-22 LAB — B12 AND FOLATE PANEL
Folate: 9.8 ng/mL (ref >3.0)
Vitamin B-12: 639 pg/mL (ref 232–1245)

## 2023-07-24 ENCOUNTER — Other Ambulatory Visit: Payer: Self-pay | Admitting: Nurse Practitioner

## 2023-07-24 DIAGNOSIS — Z0001 Encounter for general adult medical examination with abnormal findings: Secondary | ICD-10-CM

## 2023-07-24 DIAGNOSIS — I10 Essential (primary) hypertension: Secondary | ICD-10-CM

## 2023-07-31 ENCOUNTER — Encounter: Payer: Self-pay | Admitting: Podiatry

## 2023-07-31 ENCOUNTER — Ambulatory Visit (INDEPENDENT_AMBULATORY_CARE_PROVIDER_SITE_OTHER): Payer: Medicare Other | Admitting: Podiatry

## 2023-07-31 DIAGNOSIS — G5762 Lesion of plantar nerve, left lower limb: Secondary | ICD-10-CM

## 2023-07-31 MED ORDER — TRIAMCINOLONE ACETONIDE 40 MG/ML IJ SUSP
20.0000 mg | Freq: Once | INTRAMUSCULAR | Status: AC
  Administered 2023-07-31: 20 mg

## 2023-07-31 NOTE — Progress Notes (Signed)
 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  States that it feels like it is moved to the second interdigital space that she points to the area.  She is also concerned about another new area of concern with radiating pain to the fourth and fifth toes of the right foot.  She states that primarily Happens when she is in bed shoe gear and socks have no influence on it.  She says it may not happen every day but most of the time when it does happen is usually in bed when she is on her side.  Objective: Vital signs are stable alert and oriented x 3.  Pulses are palpable.  She has very little in the way of pain to the third interdigital space of the left foot where alcohol injections were previously laced.  The majority of the pain now is in second interdigital space.  She has some tenderness on end range of motion of all of those toes.  Objective's evaluation for the right foot demonstrates a porokeratotic lesion in the deep webspace of the fourth interdigital space.  There is no open lesions or wounds.  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  New neuroma to the second interdigital space of the left foot.  Interdigital corn right foot fourth interdigital space.  Plan: Injected the second interdigital space today with 5 mg of Kenalog  and local anesthetic left foot.  Also recommended that she use a Sal acid to the fourth third interspace on the right foot.  Follow-up with me in 3 weeks

## 2023-08-02 ENCOUNTER — Ambulatory Visit (INDEPENDENT_AMBULATORY_CARE_PROVIDER_SITE_OTHER): Payer: Medicare Other | Admitting: Nurse Practitioner

## 2023-08-02 ENCOUNTER — Encounter: Payer: Self-pay | Admitting: Nurse Practitioner

## 2023-08-02 VITALS — BP 124/78 | HR 78 | Temp 98.3°F | Resp 16 | Ht 63.0 in | Wt 161.8 lb

## 2023-08-02 DIAGNOSIS — E66811 Obesity, class 1: Secondary | ICD-10-CM

## 2023-08-02 DIAGNOSIS — K219 Gastro-esophageal reflux disease without esophagitis: Secondary | ICD-10-CM

## 2023-08-02 DIAGNOSIS — J302 Other seasonal allergic rhinitis: Secondary | ICD-10-CM | POA: Diagnosis not present

## 2023-08-02 DIAGNOSIS — E782 Mixed hyperlipidemia: Secondary | ICD-10-CM

## 2023-08-02 DIAGNOSIS — Z0001 Encounter for general adult medical examination with abnormal findings: Secondary | ICD-10-CM

## 2023-08-02 DIAGNOSIS — Z0000 Encounter for general adult medical examination without abnormal findings: Secondary | ICD-10-CM

## 2023-08-02 DIAGNOSIS — E6609 Other obesity due to excess calories: Secondary | ICD-10-CM

## 2023-08-02 DIAGNOSIS — Z6831 Body mass index (BMI) 31.0-31.9, adult: Secondary | ICD-10-CM

## 2023-08-02 MED ORDER — FLUTICASONE PROPIONATE 50 MCG/ACT NA SUSP: NASAL | 3 refills | Status: AC

## 2023-08-02 MED ORDER — TIRZEPATIDE-WEIGHT MANAGEMENT 5 MG/0.5ML ~~LOC~~ SOAJ: 5.0000 mg | SUBCUTANEOUS | 1 refills | Status: DC

## 2023-08-02 MED ORDER — PANTOPRAZOLE SODIUM 40 MG PO TBEC: 40.0000 mg | DELAYED_RELEASE_TABLET | Freq: Three times a day (TID) | ORAL | 3 refills | Status: AC

## 2023-08-02 MED ORDER — ZEPBOUND 2.5 MG/0.5ML ~~LOC~~ SOAJ: 2.5000 mg | SUBCUTANEOUS | 0 refills | Status: DC

## 2023-08-02 MED ORDER — ROSUVASTATIN CALCIUM 40 MG PO TABS: 40.0000 mg | ORAL_TABLET | Freq: Every day | ORAL | 3 refills | Status: AC

## 2023-08-02 NOTE — Progress Notes (Signed)
 [facility] [address] [place], [place] [address]  Internal MEDICINE  Office Visit Note  Patient Name: Jordan Carpenter  [id number]  [id number]  Date of Service: 09/09/2023  Chief Complaint  Patient presents with   Hyperlipidemia   Gastroesophageal Reflux   Hypertension   Medicare Wellness    HPI Jordan Carpenter presents for an annual well visit and physical exam.  Well-appearing 73 y.o. female with hypertension, migraines, GERD, arthritis and high cholesterol.  Routine CRC screening: due in 2031  Routine mammogram: due in April this year  DEXA scan: done in 2022  Labs: labs were grossly normal, discussed with patient, vitamin D level was slightly elevated, patient has stopped taking vitamin D supplement.  New or worsening pain: none  Other concerns: Patient has tried phentermine for weight loss several years ago with minimal results.  Recently she tried diethylpropion for 4 months with minimal results as well.  A sample of contrave was provided to patient but she experience unwanted adverse reactions and the medication prescription was denied by insurance anyways.  Qysmia was prescribed in 2023 but never successfully approved by her insurance at that time.  Wants to try zepbound now.      08/02/2023    9:09 AM 07/27/2022    9:03 AM 07/26/2021    9:54 AM  MMSE - Mini Mental State Exam  Orientation to time 5 5 5   Orientation to Place 5 5 5   Registration 3 3 3   Attention/ Calculation 5 5   Recall 3 3 3   Language- name 2 objects 2 2 2   Language- repeat 1 1 1   Language- follow 3 step command 3 3 3   Language- read & follow direction 1 1 1   Write a sentence 1 1 1   Copy design 1 1 1   Total score 30 30     Functional Status Survey: Is the patient deaf or have difficulty hearing?: No Does the patient have difficulty seeing, even when wearing glasses/contacts?: No Does the patient have difficulty concentrating, remembering, or making decisions?: No Does the patient have  difficulty walking or climbing stairs?: No Does the patient have difficulty dressing or bathing?: No Does the patient have difficulty doing errands alone such as visiting a doctor's office or shopping?: No     08/23/2021   10:55 AM 12/20/2021   11:30 AM 07/27/2022    9:02 AM 01/25/2023    9:43 AM 08/02/2023    9:09 AM  Fall Risk  Falls in the past year? 0 0 0 0 0  Was there an injury with Fall?   0 0 0  Fall Risk Category Calculator   0 0 0  (RETIRED) Patient Fall Risk Level Low fall risk Low fall risk     Patient at Risk for Falls Due to No Fall Risks No Fall Risks No Fall Risks No Fall Risks No Fall Risks  Fall risk Follow up Falls evaluation completed Falls evaluation completed Falls evaluation completed Falls evaluation completed Falls evaluation completed       08/02/2023    9:09 AM  Depression screen PHQ 2/9  Decreased Interest 0  Down, Depressed, Hopeless 0  PHQ - 2 Score 0        Current Medication: Outpatient Encounter Medications as of 08/02/2023  Medication Sig   Coenzyme Q10 400 MG CAPS Take by mouth.   ezetimibe (ZETIA) 10 MG tablet TAKE 1 TABLET DAILY   lisinopril-hydrochlorothiazide (ZESTORETIC) 20-25 MG tablet TAKE 1 TABLET DAILY   Omega-3 Fatty  Acids (FISH OIL CONCENTRATE) 1000 MG CAPS    tirzepatide (ZEPBOUND) 2.5 MG/0.5ML Pen Inject 2.5 mg into the skin once a week.   tirzepatide (ZEPBOUND) 5 MG/0.5ML Pen Inject 5 mg into the skin once a week.   [DISCONTINUED] Diethylpropion HCl CR 75 MG TB24 Take 1 tablet (75 mg total) by mouth daily before breakfast.   [DISCONTINUED] fluticasone (FLONASE) 50 MCG/ACT nasal spray 1-2 spray in each nostril once or twice day   [DISCONTINUED] pantoprazole (PROTONIX) 40 MG tablet Take 1 tablet (40 mg total) by mouth 3 (three) times daily.   [DISCONTINUED] rosuvastatin (CRESTOR) 40 MG tablet TAKE 1 TABLET DAILY   calcium carbonate (OS-CAL) 1250 (500 Ca) MG chewable tablet Chew 1 tablet by mouth daily.   cetirizine (ZYRTEC) 10 MG  tablet Take by mouth.   Cholecalciferol 25 MCG (1000 UT) capsule Take by mouth.   CINNAMON PO Take 600 mg by mouth in the morning and at bedtime.   Elderberry 500 MG CAPS Take by mouth.   famotidine (PEPCID) 20 MG tablet Take 1 tablet (20 mg total) by mouth at bedtime.   fluticasone (FLONASE) 50 MCG/ACT nasal spray 1-2 spray in each nostril once or twice day   pantoprazole (PROTONIX) 40 MG tablet Take 1 tablet (40 mg total) by mouth 3 (three) times daily.   rosuvastatin (CRESTOR) 40 MG tablet Take 1 tablet (40 mg total) by mouth daily.   [DISCONTINUED] Naltrexone-buPROPion HCl ER (CONTRAVE) 8-90 MG TB12 Take 2 tablets by mouth in the morning and at bedtime.   [DISCONTINUED] ranitidine (ZANTAC) 300 MG tablet    [DISCONTINUED] SUMAtriptan (IMITREX) 20 MG/ACT nasal spray Place into the nose.   [DISCONTINUED] tirzepatide (ZEPBOUND) 2.5 MG/0.5ML Pen Inject 2.5 mg into the skin once a week.   No facility-administered encounter medications on file as of 08/02/2023.    Surgical History: Past Surgical History:  Procedure Laterality Date   BACK SURGERY Left    2004   BREAST EXCISIONAL BIOPSY Right 1997   neg   CATARACT EXTRACTION Bilateral    eye lift Left    KNEE SURGERY Left    2007 AGAIN   REPLACEMENT TOTAL KNEE     TUBAL LIGATION      Medical History: Past Medical History:  Diagnosis Date   GERD (gastroesophageal reflux disease)    Hyperlipidemia    Hypertension     Family History: Family History  Problem Relation Age of Onset   Lung cancer Mother    Lymphoma Father    Lung cancer Brother    Breast cancer Maternal Aunt 60   Ovarian cancer Paternal Aunt    Kidney cancer Maternal Grandmother    Breast cancer Paternal Grandmother     Social History   Socioeconomic History   Marital status: Married    Spouse name: Not on file   Number of children: Not on file   Years of education: Not on file   Highest education level: Not on file  Occupational History   Not on file   Tobacco Use   Smoking status: Never    Passive exposure: Never   Smokeless tobacco: Never  Substance and Sexual Activity   Alcohol use: Yes    Alcohol/week: 1.0 standard drink of alcohol    Types: 1 Glasses of wine per week    Comment: occ.   Drug use: Never   Sexual activity: Not on file  Other Topics Concern   Not on file  Social History Narrative   Not on  file   Social Drivers of Corporate investment banker Strain: Not on file  Food Insecurity: Not on file  Transportation Needs: Not on file  Physical Activity: Not on file  Stress: Not on file  Social Connections: Not on file  Intimate Partner Violence: Not on file      Review of Systems  Constitutional:  Positive for unexpected weight change. Negative for activity change, appetite change, chills, fatigue and fever.  HENT: Negative.  Negative for congestion, ear pain, rhinorrhea, sore throat and trouble swallowing.   Eyes: Negative.   Respiratory: Negative.  Negative for cough, chest tightness, shortness of breath and wheezing.   Cardiovascular: Negative.  Negative for chest pain.  Gastrointestinal: Negative.  Negative for abdominal pain, blood in stool, constipation, diarrhea, nausea and vomiting.  Endocrine: Negative.   Genitourinary: Negative.  Negative for difficulty urinating, dysuria, frequency, hematuria and urgency.  Musculoskeletal: Negative.  Negative for arthralgias, back pain, joint swelling, myalgias and neck pain.  Skin: Negative.  Negative for rash and wound.  Allergic/Immunologic: Negative.  Negative for immunocompromised state.  Neurological: Negative.  Negative for dizziness, seizures, numbness and headaches.  Hematological: Negative.   Psychiatric/Behavioral: Negative.  Negative for behavioral problems, self-injury and suicidal ideas. The patient is not nervous/anxious.     Vital Signs: BP 124/78   Pulse 78   Temp 98.3 F (36.8 C)   Resp 16   Ht 5\' 3"  (1.6 m)   Wt 161 lb 12.8 oz (73.4 kg)    SpO2 97%   BMI 28.66 kg/m    Physical Exam Vitals reviewed.  Constitutional:      General: She is awake. She is not in acute distress.    Appearance: Normal appearance. She is well-developed and well-groomed. She is obese. She is not ill-appearing or diaphoretic.  HENT:     Head: Normocephalic and atraumatic.     Mouth/Throat:     Lips: Pink.     Pharynx: Uvula midline.  Eyes:     General: Lids are normal. Vision grossly intact. Gaze aligned appropriately.     Extraocular Movements: Extraocular movements intact.     Conjunctiva/sclera: Conjunctivae normal.     Pupils: Pupils are equal, round, and reactive to light.     Funduscopic exam:    Right eye: Red reflex present.        Left eye: Red reflex present. Neck:     Thyroid: No thyromegaly.     Vascular: No JVD.     Trachea: No tracheal deviation.  Cardiovascular:     Rate and Rhythm: Normal rate and regular rhythm.     Pulses: Normal pulses.     Heart sounds: Normal heart sounds, S1 normal and S2 normal. No murmur heard.    No friction rub. No gallop.  Pulmonary:     Effort: Pulmonary effort is normal. No accessory muscle usage or respiratory distress.     Breath sounds: Normal breath sounds and air entry. No stridor. No wheezing or rales.  Chest:     Chest wall: No tenderness.     Comments: Declined breast exam Abdominal:     Palpations: There is no shifting dullness, fluid wave or pulsatile mass.  Skin:    General: Skin is warm and dry.  Neurological:     Mental Status: She is alert and oriented to person, place, and time.     Cranial Nerves: No cranial nerve deficit.     Motor: No abnormal muscle tone.  Coordination: Coordination normal.     Gait: Gait normal.     Deep Tendon Reflexes: Reflexes are normal and symmetric.  Psychiatric:        Mood and Affect: Mood normal.        Behavior: Behavior normal. Behavior is cooperative.        Thought Content: Thought content normal.        Judgment: Judgment  normal.        Assessment/Plan: 1. Encounter for subsequent medicare annual wellness visit.  Age-appropriate preventive screenings and vaccinations discussed. Routine labs for health maintenance will be ordered later. PHM updated.    2. Gastroesophageal reflux disease without esophagitis Stable, continue pantoprazole as prescribed.  - pantoprazole (PROTONIX) 40 MG tablet; Take 1 tablet (40 mg total) by mouth 3 (three) times daily.  Dispense: 270 tablet; Refill: 3  3. Mixed hyperlipidemia Continue rosuvastatin as prescribed.  - rosuvastatin (CRESTOR) 40 MG tablet; Take 1 tablet (40 mg total) by mouth daily.  Dispense: 90 tablet; Refill: 3  4. Seasonal allergies Continue flonase nasal spray as needed as prescribed.  - fluticasone (FLONASE) 50 MCG/ACT nasal spray; 1-2 spray in each nostril once or twice day  Dispense: 33.3 mL; Refill: 3  5. Class 1 obesity due to excess calories with serious comorbidity and body mass index (BMI) of 31.0 to 31.9 in adult Discontinue diethylpropion and contrave. Zepbound prescribed, start with 2.5 mg weekly for 4 doses, then increase to 5 mg weekly. Will complete prior authorization which should be approved since she has tried other medications now.  - tirzepatide (ZEPBOUND) 2.5 MG/0.5ML Pen; Inject 2.5 mg into the skin once a week.  Dispense: 2 mL; Refill: 0 - tirzepatide (ZEPBOUND) 5 MG/0.5ML Pen; Inject 5 mg into the skin once a week.  Dispense: 6 mL; Refill: 1    General Counseling: Nevea verbalizes understanding of the findings of todays visit and agrees with plan of treatment. I have discussed any further diagnostic evaluation that may be needed or ordered today. We also reviewed her medications today. she has been encouraged to call the office with any questions or concerns that should arise related to todays visit.    No orders of the defined types were placed in this encounter.   Meds ordered this encounter  Medications   tirzepatide  (ZEPBOUND) 2.5 MG/0.5ML Pen    Sig: Inject 2.5 mg into the skin once a week.    Dispense:  2 mL    Refill:  0    Fill new script, patietn to take 2.5 mg weekly for 4 doses then will increase to 5 mg dose which will be filled by mail order pharmacy. Send prior authorization request asap please   tirzepatide (ZEPBOUND) 5 MG/0.5ML Pen    Sig: Inject 5 mg into the skin once a week.    Dispense:  6 mL    Refill:  1    Patient start 5 mg dose after 4 doses of 2.5 mg which will be provided by local pharmacy then she will receive subsequent prescriptions through mail order pharmacy.   pantoprazole (PROTONIX) 40 MG tablet    Sig: Take 1 tablet (40 mg total) by mouth 3 (three) times daily.    Dispense:  270 tablet    Refill:  3   rosuvastatin (CRESTOR) 40 MG tablet    Sig: Take 1 tablet (40 mg total) by mouth daily.    Dispense:  90 tablet    Refill:  3   fluticasone (FLONASE)  50 MCG/ACT nasal spray    Sig: 1-2 spray in each nostril once or twice day    Dispense:  33.3 mL    Refill:  3    Please provide 3 month supply with each full thanks    Return in about 3 months (around 10/30/2023) for F/U, Weight loss, Teola Felipe PCP.   Total time spent:30 Minutes Time spent includes review of chart, medications, test results, and follow up plan with the patient.   [place] Controlled Substance Database was reviewed by me.  This patient was seen by Sallyanne Kuster, FNP-C in collaboration with Dr. Beverely Risen as a part of collaborative care agreement.  Tamura Lasky R. Tedd Sias, MSN, FNP-C Internal medicine

## 2023-08-04 ENCOUNTER — Encounter: Payer: Self-pay | Admitting: Nurse Practitioner

## 2023-08-07 ENCOUNTER — Telehealth: Payer: Self-pay

## 2023-08-07 NOTE — Telephone Encounter (Signed)
Completed P.A. for patient's Zepbound. 

## 2023-08-23 ENCOUNTER — Ambulatory Visit: Payer: Medicare Other | Admitting: Podiatry

## 2023-09-04 ENCOUNTER — Other Ambulatory Visit: Payer: Self-pay | Admitting: Nurse Practitioner

## 2023-09-04 DIAGNOSIS — Z1231 Encounter for screening mammogram for malignant neoplasm of breast: Secondary | ICD-10-CM

## 2023-09-09 ENCOUNTER — Encounter: Payer: Self-pay | Admitting: Nurse Practitioner

## 2023-09-12 ENCOUNTER — Encounter: Payer: Self-pay | Admitting: Nurse Practitioner

## 2023-09-13 ENCOUNTER — Ambulatory Visit (INDEPENDENT_AMBULATORY_CARE_PROVIDER_SITE_OTHER): Payer: Medicare Other | Admitting: Podiatry

## 2023-09-13 ENCOUNTER — Encounter: Payer: Self-pay | Admitting: Podiatry

## 2023-09-13 DIAGNOSIS — D2371 Other benign neoplasm of skin of right lower limb, including hip: Secondary | ICD-10-CM | POA: Diagnosis not present

## 2023-09-13 DIAGNOSIS — G5762 Lesion of plantar nerve, left lower limb: Secondary | ICD-10-CM

## 2023-09-13 DIAGNOSIS — G5782 Other specified mononeuropathies of left lower limb: Secondary | ICD-10-CM | POA: Diagnosis not present

## 2023-09-13 NOTE — Progress Notes (Signed)
 She presents today for follow-up of her neuroma stating that is doing much better still has some tenderness on that lateral aspect of her foot that she has had for 23 years from her back surgery.  She states that supposes my back has been bothering me this is the cause of this.  Objective: Vital signs are stable she is alert and oriented x 3 very little in the way of reproducible pain on palpation to the third interspace of the left foot.  Her interdigital lesion fourth interdigital space right foot, has resolved completely.  She continues to be Compound W acid therapy weekly.  Assessment: Resolving soft tissue lesion fourth interspace right foot.  Resolving neuroma third interspace left foot.  Plan: Continue acid treatment to the fourth interdigital space weekly to every other week and follow-up with me should her neuroma worsen.

## 2023-09-19 ENCOUNTER — Other Ambulatory Visit: Payer: Self-pay

## 2023-09-19 MED ORDER — FEXOFENADINE HCL 180 MG PO TABS: 180.0000 mg | ORAL_TABLET | Freq: Every day | ORAL | 2 refills | Status: DC

## 2023-09-19 NOTE — Telephone Encounter (Signed)
 Spoke with advised she can try OTC Miralax for constipation  and we will send allegra D pres after discuss with DFK

## 2023-10-10 IMAGING — MG MM DIGITAL SCREENING BILAT W/ TOMO AND CAD
8 series · 8 of 24 positions shown · non-contrast
Comparison: Previous exam(s).

CLINICAL DATA: Screening.

EXAM:
DIGITAL SCREENING BILATERAL MAMMOGRAM WITH TOMOSYNTHESIS AND CAD
TECHNIQUE: Bilateral screening digital craniocaudal and mediolateral oblique
mammograms were obtained. Bilateral screening digital breast
tomosynthesis was performed. The images were evaluated with
computer-aided detection.

[R CC synth-2D]
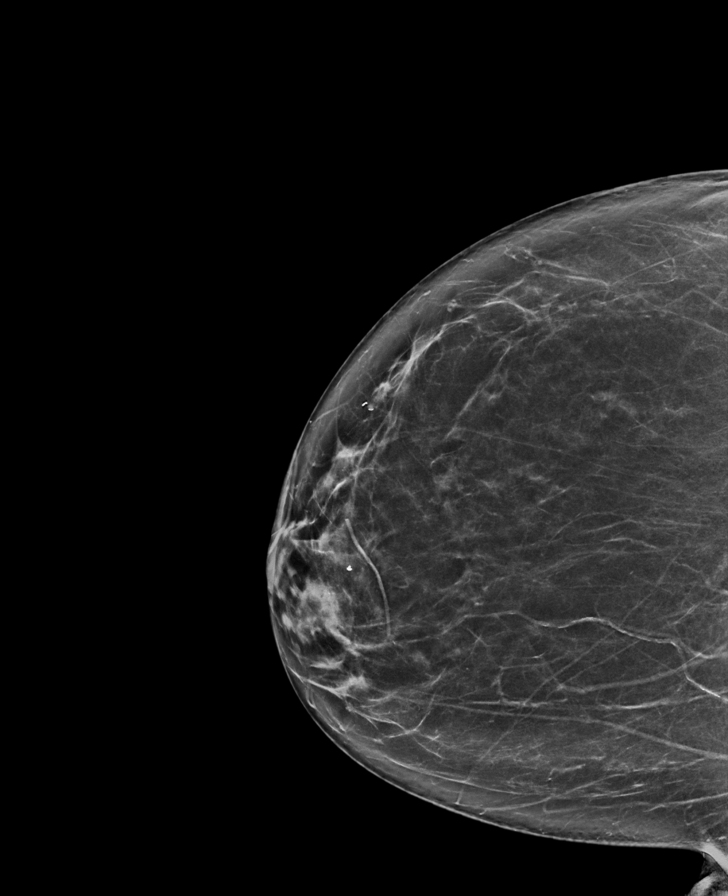

[R MLO synth-2D]
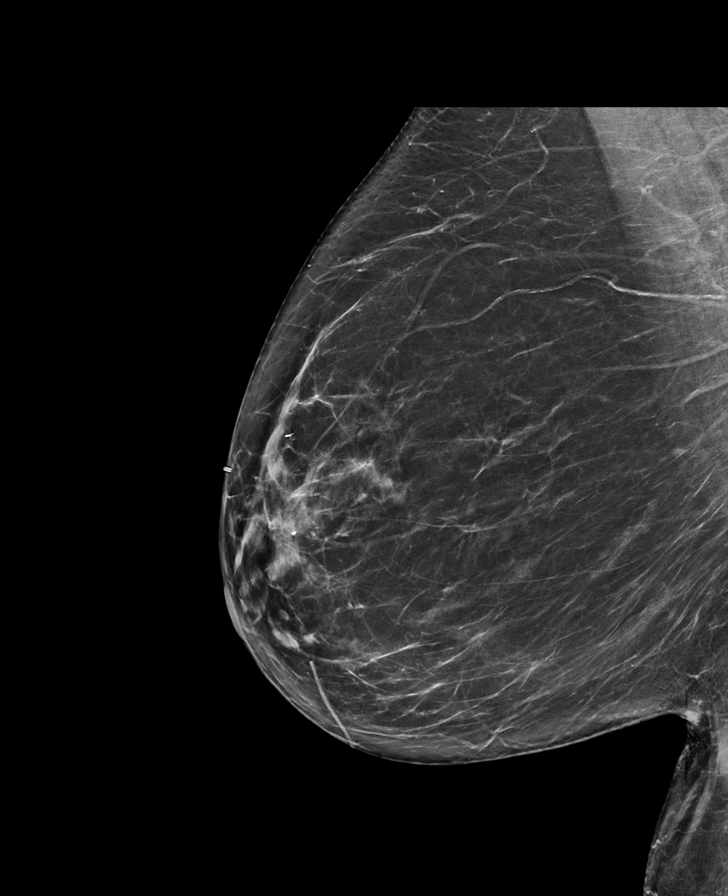

[L CC synth-2D]
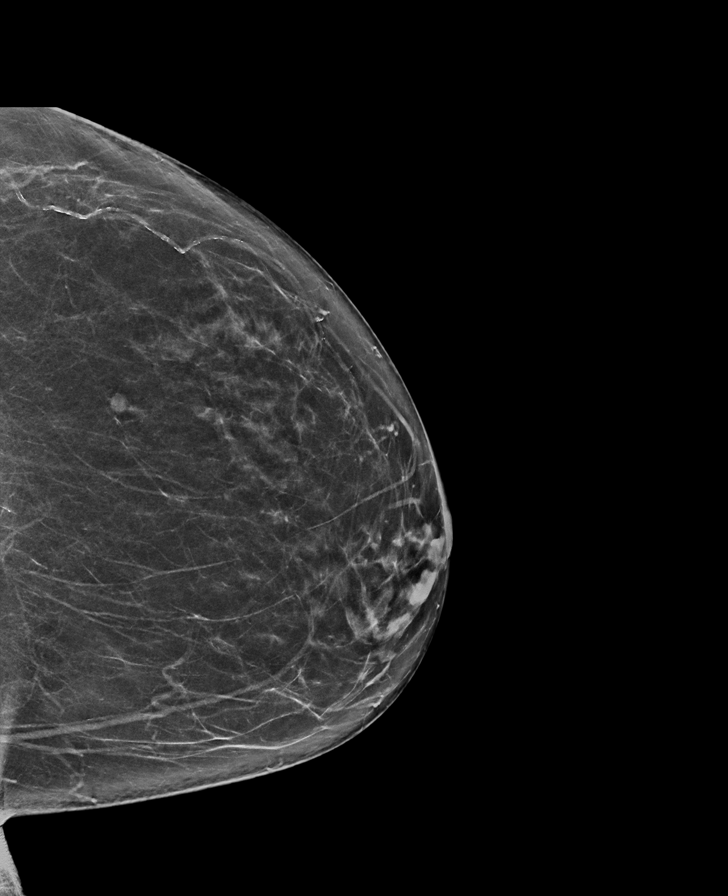

[L MLO synth-2D]
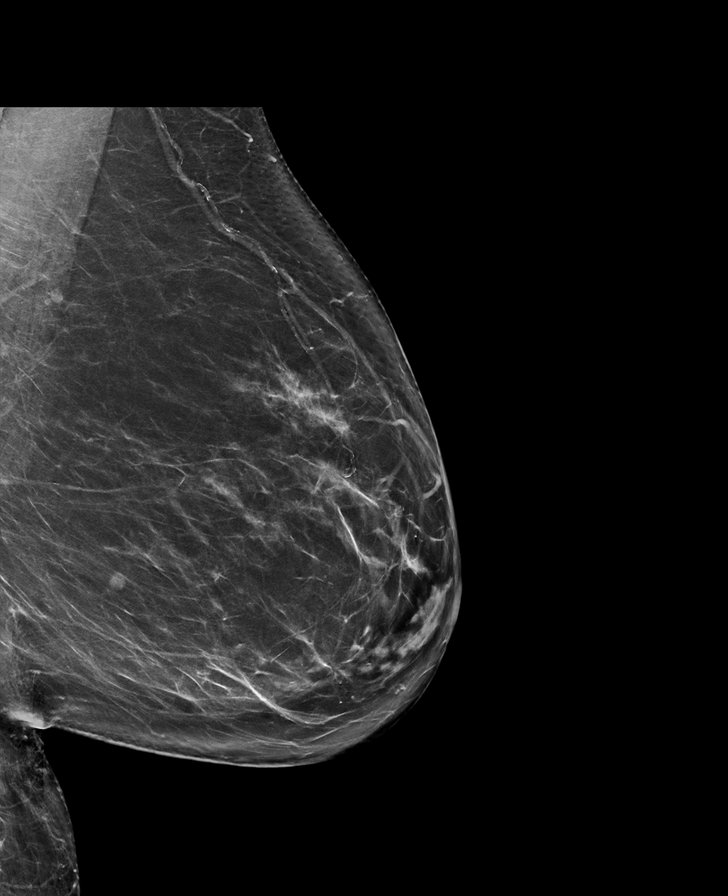

[R CC tomo · tomo slice 37/74.0]
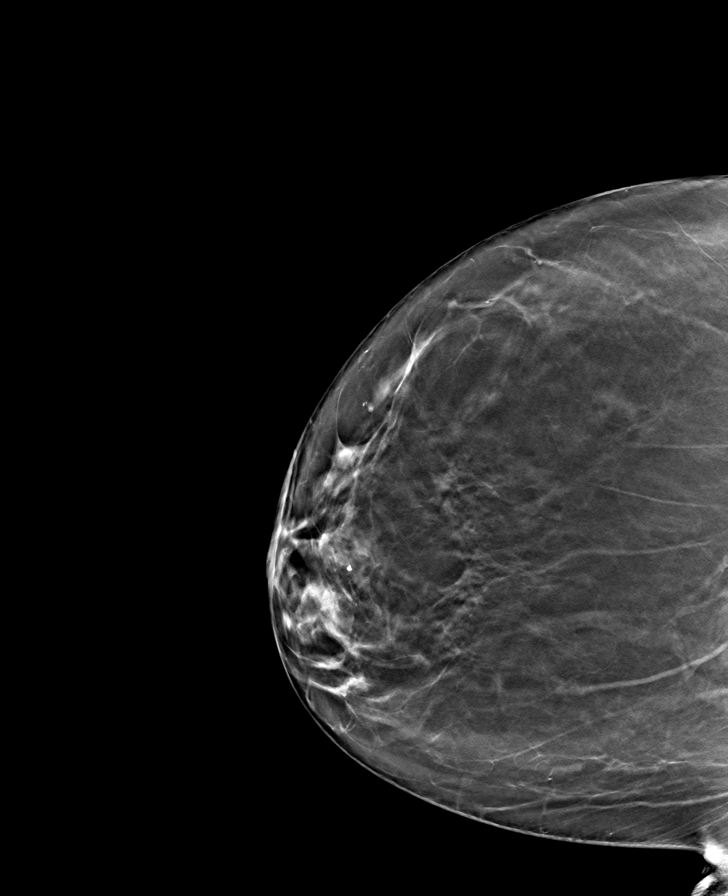

[R MLO tomo · tomo slice 39/78.0]
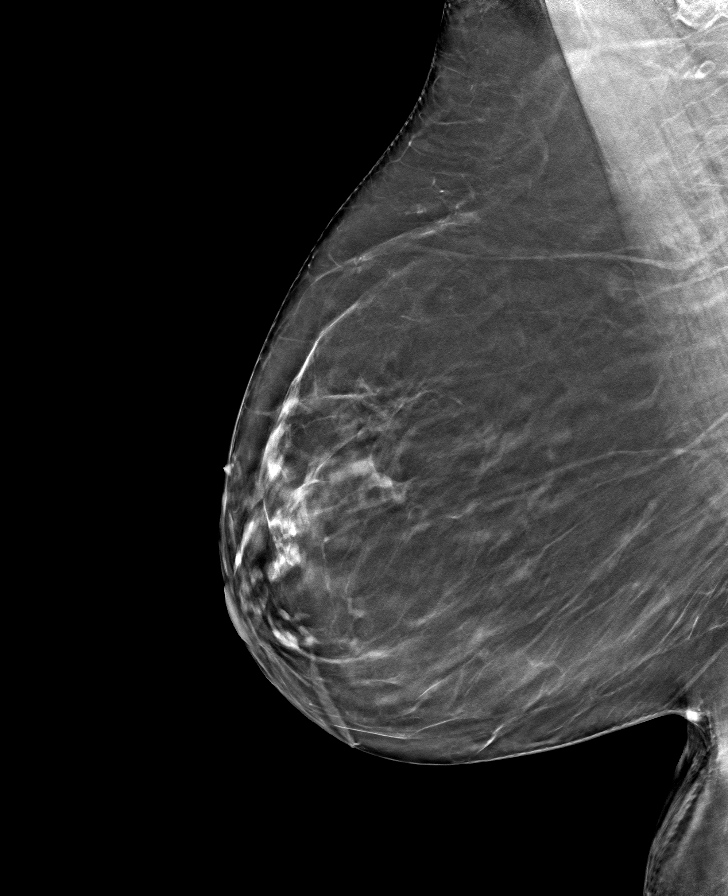

[L MLO tomo · tomo slice 41/80.0]
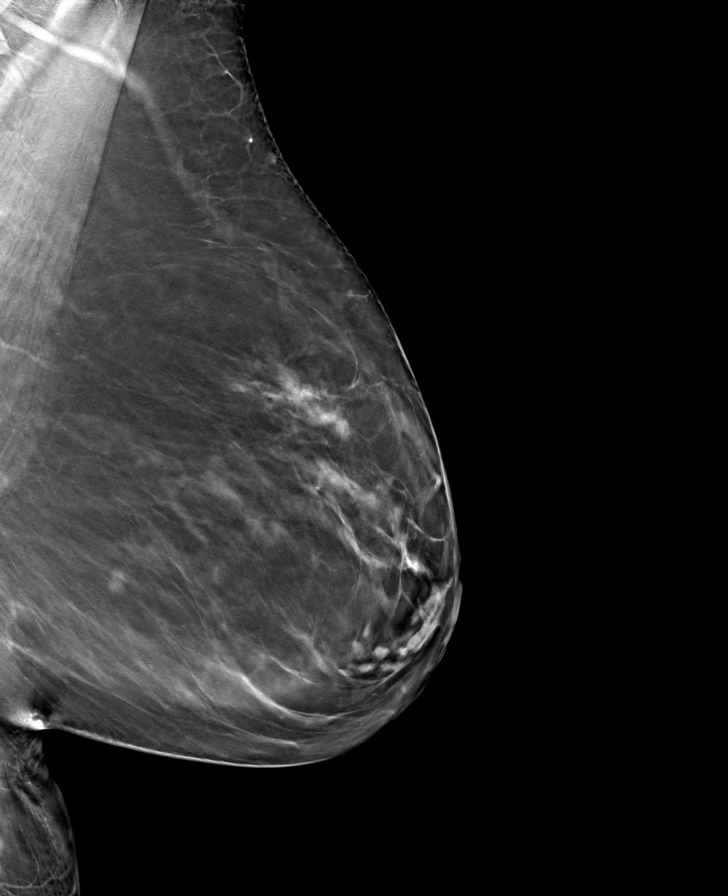

[L CC tomo · tomo slice 36/71.0]
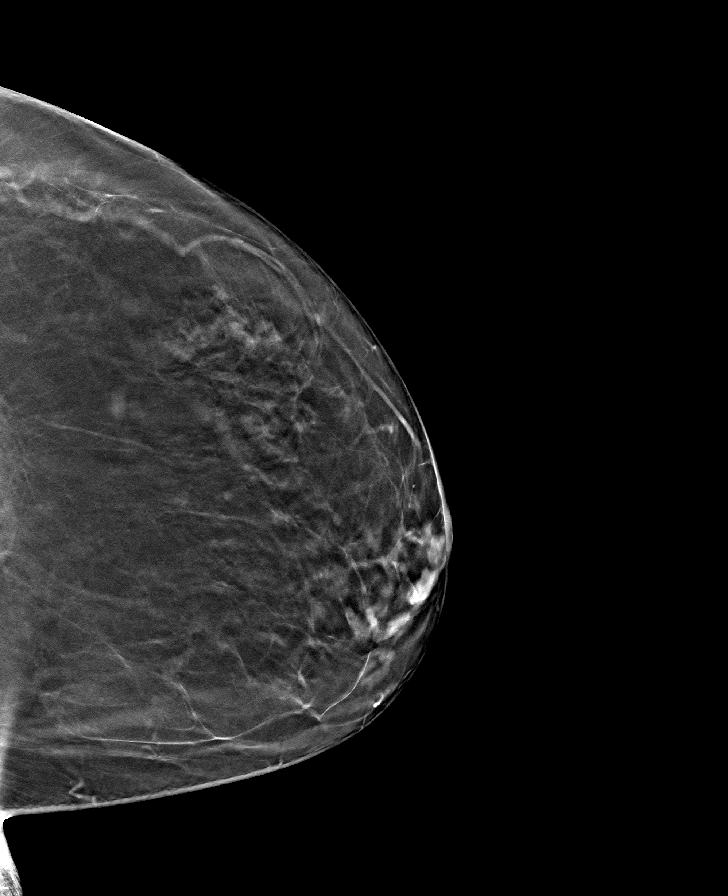

[8 of 24 positions shown; findings below may reference images not displayed]

ACR Breast Density Category b: There are scattered areas of
fibroglandular density.
FINDINGS: There are no findings suspicious for malignancy.
IMPRESSION: No mammographic evidence of malignancy. A result letter of this
screening mammogram will be mailed directly to the patient.

RECOMMENDATION:
Screening mammogram in one year. (Code:51-O-LD2)

BI-RADS CATEGORY  1: Negative.

## 2023-10-18 ENCOUNTER — Ambulatory Visit
Admission: RE | Admit: 2023-10-18 | Discharge: 2023-10-18 | Disposition: A | Discharge status: Home / Self Care | Source: Ambulatory Visit | Attending: Nurse Practitioner | Admitting: Nurse Practitioner

## 2023-10-18 DIAGNOSIS — Z1231 Encounter for screening mammogram for malignant neoplasm of breast: Secondary | ICD-10-CM | POA: Insufficient documentation

## 2023-11-01 ENCOUNTER — Ambulatory Visit (INDEPENDENT_AMBULATORY_CARE_PROVIDER_SITE_OTHER): Payer: Medicare Other | Admitting: Nurse Practitioner

## 2023-11-01 ENCOUNTER — Encounter: Payer: Self-pay | Admitting: Nurse Practitioner

## 2023-11-01 VITALS — BP 110/70 | HR 75 | Temp 98.4°F | Resp 16 | Ht 63.0 in | Wt 148.6 lb

## 2023-11-01 DIAGNOSIS — Z6826 Body mass index (BMI) 26.0-26.9, adult: Secondary | ICD-10-CM

## 2023-11-01 DIAGNOSIS — E663 Overweight: Secondary | ICD-10-CM

## 2023-11-01 DIAGNOSIS — E782 Mixed hyperlipidemia: Secondary | ICD-10-CM | POA: Diagnosis not present

## 2023-11-01 DIAGNOSIS — I10 Essential (primary) hypertension: Secondary | ICD-10-CM

## 2023-11-01 DIAGNOSIS — J302 Other seasonal allergic rhinitis: Secondary | ICD-10-CM

## 2023-11-01 DIAGNOSIS — R252 Cramp and spasm: Secondary | ICD-10-CM | POA: Diagnosis not present

## 2023-11-01 MED ORDER — TIRZEPATIDE-WEIGHT MANAGEMENT 7.5 MG/0.5ML ~~LOC~~ SOAJ: 7.5000 mg | SUBCUTANEOUS | 1 refills | Status: DC

## 2023-11-01 MED ORDER — FEXOFENADINE HCL 180 MG PO TABS: 180.0000 mg | ORAL_TABLET | Freq: Every day | ORAL | 3 refills | Status: AC

## 2023-11-01 NOTE — Progress Notes (Signed)
 [facility] [address] [place], [place] [address]  Internal MEDICINE  Office Visit Note  Patient Name: Jordan Carpenter  [id number]  [id number]  Date of Service: 11/01/2023  Chief Complaint  Patient presents with   Follow-up   Weight Loss    HPI Jordan Carpenter presents for a follow-up visit for hypertension, weight loss, leg cramps and allergies Hypertension -- controlled with lisinopril -hydrochlorothiazide   Weight loss -- lost 13 lbs since her last office visit. Goal is 140 lbs.  Leg cramps -- stopped taking zetia  and these have resolved.  Allergic rhinitis -- taking allegra     Current Medication: Outpatient Encounter Medications as of 11/01/2023  Medication Sig   calcium  carbonate (OS-CAL) 1250 (500 Ca) MG chewable tablet Chew 1 tablet by mouth daily.   Cholecalciferol 25 MCG (1000 UT) capsule Take by mouth.   CINNAMON PO Take 600 mg by mouth in the morning and at bedtime.   Coenzyme Q10 400 MG CAPS Take by mouth.   Elderberry 500 MG CAPS Take by mouth.   famotidine  (PEPCID ) 20 MG tablet Take 1 tablet (20 mg total) by mouth at bedtime.   fluticasone  (FLONASE ) 50 MCG/ACT nasal spray 1-2 spray in each nostril once or twice day   lisinopril -hydrochlorothiazide  (ZESTORETIC ) 20-25 MG tablet TAKE 1 TABLET DAILY   Omega-3 Fatty Acids (FISH OIL CONCENTRATE) 1000 MG CAPS    pantoprazole  (PROTONIX ) 40 MG tablet Take 1 tablet (40 mg total) by mouth 3 (three) times daily.   rosuvastatin  (CRESTOR ) 40 MG tablet Take 1 tablet (40 mg total) by mouth daily.   [START ON 12/25/2023] tirzepatide  (ZEPBOUND ) 7.5 MG/0.5ML Pen Inject 7.5 mg into the skin once a week.   [DISCONTINUED] cetirizine (ZYRTEC) 10 MG tablet Take by mouth.   [DISCONTINUED] fexofenadine  (ALLEGRA ) 180 MG tablet Take 1 tablet (180 mg total) by mouth daily.   [DISCONTINUED] tirzepatide  (ZEPBOUND ) 2.5 MG/0.5ML Pen Inject 2.5 mg into the skin once a week.   [DISCONTINUED] tirzepatide  (ZEPBOUND ) 5 MG/0.5ML Pen Inject 5 mg into the  skin once a week.   fexofenadine  (ALLEGRA ) 180 MG tablet Take 1 tablet (180 mg total) by mouth daily.   [DISCONTINUED] ezetimibe  (ZETIA ) 10 MG tablet TAKE 1 TABLET DAILY (Patient not taking: Reported on 11/01/2023)   No facility-administered encounter medications on file as of 11/01/2023.    Surgical History: Past Surgical History:  Procedure Laterality Date   BACK SURGERY Left    2004   BREAST EXCISIONAL BIOPSY Right 1997   neg   CATARACT EXTRACTION Bilateral    eye lift Left    KNEE SURGERY Left    2007 AGAIN   REPLACEMENT TOTAL KNEE     TUBAL LIGATION      Medical History: Past Medical History:  Diagnosis Date   GERD (gastroesophageal reflux disease)    Hyperlipidemia    Hypertension     Family History: Family History  Problem Relation Age of Onset   Lung cancer Mother    Lymphoma Father    Lung cancer Brother    Breast cancer Maternal Aunt 60   Ovarian cancer Paternal Aunt    Kidney cancer Maternal Grandmother    Breast cancer Paternal Grandmother     Social History   Socioeconomic History   Marital status: Married    Spouse name: Not on file   Number of children: Not on file   Years of education: Not on file   Highest education level: Not on file  Occupational History   Not on file  Tobacco Use   Smoking status: Never    Passive exposure: Never   Smokeless tobacco: Never  Substance and Sexual Activity   Alcohol use: Yes    Alcohol/week: 1.0 standard drink of alcohol    Types: 1 Glasses of wine per week    Comment: occ.   Drug use: Never   Sexual activity: Not on file  Other Topics Concern   Not on file  Social History Narrative   Not on file   Social Drivers of Health   Financial Resource Strain: Not on file  Food Insecurity: Not on file  Transportation Needs: Not on file  Physical Activity: Not on file  Stress: Not on file  Social Connections: Not on file  Intimate Partner Violence: Not on file      Review of Systems   Constitutional:  Positive for appetite change and unexpected weight change. Negative for fatigue.  HENT: Negative.  Negative for congestion.   Respiratory:  Negative for cough, chest tightness, shortness of breath and wheezing.   Cardiovascular: Negative.  Negative for chest pain and palpitations.  Genitourinary: Negative.   Musculoskeletal: Negative.   Neurological: Negative.     Vital Signs: BP 110/70   Pulse 75   Temp 98.4 F (36.9 C)   Resp 16   Ht 5\' 3"  (1.6 m)   Wt 148 lb 9.6 oz (67.4 kg)   SpO2 96%   BMI 26.32 kg/m    Physical Exam Vitals reviewed.  Constitutional:      General: She is not in acute distress.    Appearance: Normal appearance. She is obese. She is not ill-appearing.  HENT:     Head: Normocephalic and atraumatic.  Eyes:     Pupils: Pupils are equal, round, and reactive to light.  Cardiovascular:     Rate and Rhythm: Normal rate and regular rhythm.  Pulmonary:     Effort: Pulmonary effort is normal. No respiratory distress.  Neurological:     Mental Status: She is alert and oriented to person, place, and time.  Psychiatric:        Mood and Affect: Mood normal.        Behavior: Behavior normal.        Assessment/Plan: 1. Primary hypertension (Primary) Stable, continue lisinopril -hydrochlorothiazide  as prescribed.   2. Mixed hyperlipidemia Continue fish oil supplement and rosuvastatin  as prescribed   3. Leg cramps Resolved with stopping zetia   4. Seasonal allergies Continue allegra  as prescribed   5. Overweight with body mass index (BMI) of 26 to 26.9 in adult Continue zepbound  as prescribed.    General Counseling: Jordan Carpenter understanding of the findings of todays visit and agrees with plan of treatment. I have discussed any further diagnostic evaluation that may be needed or ordered today. We also reviewed her medications today. she has been encouraged to call the office with any questions or concerns that should arise related  to todays visit.    No orders of the defined types were placed in this encounter.   Meds ordered this encounter  Medications   tirzepatide  (ZEPBOUND ) 7.5 MG/0.5ML Pen    Sig: Inject 7.5 mg into the skin once a week.    Dispense:  6 mL    Refill:  1    Increase dose of zepbound  for next fill due in 3 months   fexofenadine  (ALLEGRA ) 180 MG tablet    Sig: Take 1 tablet (180 mg total) by mouth daily.    Dispense:  90 tablet  Refill:  3    Return in about 3 months (around 02/01/2024) for F/U, Jordan Carpenter PCP, Weight loss.   Total time spent:30 Minutes Time spent includes review of chart, medications, test results, and follow up plan with the patient.   [place] Controlled Substance Database was reviewed by me.  This patient was seen by Laurence Pons, FNP-C in collaboration with Dr. Verneta Gone as a part of collaborative care agreement.   Jordan Husband R. Bobbi Burow, MSN, FNP-C Internal medicine

## 2024-01-22 ENCOUNTER — Encounter: Payer: Self-pay | Admitting: Nurse Practitioner

## 2024-01-31 ENCOUNTER — Ambulatory Visit: Admitting: Nurse Practitioner

## 2024-01-31 ENCOUNTER — Encounter: Payer: Self-pay | Admitting: Nurse Practitioner

## 2024-01-31 VITALS — BP 110/70 | HR 71 | Temp 98.2°F | Resp 16 | Ht 63.0 in | Wt 140.8 lb

## 2024-01-31 DIAGNOSIS — M25552 Pain in left hip: Secondary | ICD-10-CM | POA: Diagnosis not present

## 2024-01-31 DIAGNOSIS — M19041 Primary osteoarthritis, right hand: Secondary | ICD-10-CM | POA: Diagnosis not present

## 2024-01-31 DIAGNOSIS — I10 Essential (primary) hypertension: Secondary | ICD-10-CM

## 2024-01-31 DIAGNOSIS — E663 Overweight: Secondary | ICD-10-CM

## 2024-01-31 DIAGNOSIS — M19042 Primary osteoarthritis, left hand: Secondary | ICD-10-CM | POA: Insufficient documentation

## 2024-01-31 DIAGNOSIS — G8929 Other chronic pain: Secondary | ICD-10-CM | POA: Insufficient documentation

## 2024-01-31 DIAGNOSIS — Z6826 Body mass index (BMI) 26.0-26.9, adult: Secondary | ICD-10-CM

## 2024-01-31 MED ORDER — ZEPBOUND 10 MG/0.5ML ~~LOC~~ SOAJ: 10.0000 mg | SUBCUTANEOUS | 1 refills | Status: AC

## 2024-01-31 MED ORDER — CELECOXIB 200 MG PO CAPS: 200.0000 mg | ORAL_CAPSULE | Freq: Two times a day (BID) | ORAL | 1 refills | Status: AC

## 2024-01-31 NOTE — Progress Notes (Signed)
 [facility] [address] [place], [place] [address]  Internal MEDICINE  Office Visit Note  Patient Name: Jordan Carpenter  [id number]  [id number]  Date of Service: 01/31/2024  Chief Complaint  Patient presents with   Gastroesophageal Reflux   Hypertension   Hyperlipidemia   Follow-up    Weight loss     HPI Audery presents for a follow-up visit for hypertension, weight loss and left hip pain and arthritis of both hands. Hypertension -- bp has been lower than her previous average with some slight symptoms of hypotension most likely due to recent weight loss. May need to adjust dose Weight loss -- taking zepbound , weight is down 8 more lbs, and BMI is now in normal range. Wants to lose about 5 more lbs. Left hip pain and arthritis of both hands -- requesting celebrex  refill.    Current Medication: Outpatient Encounter Medications as of 01/31/2024  Medication Sig   calcium  carbonate (OS-CAL) 1250 (500 Ca) MG chewable tablet Chew 1 tablet by mouth daily.   celecoxib  (CELEBREX ) 200 MG capsule Take 1 capsule (200 mg total) by mouth 2 (two) times daily.   Cholecalciferol 25 MCG (1000 UT) capsule Take by mouth.   CINNAMON PO Take 600 mg by mouth in the morning and at bedtime.   Coenzyme Q10 400 MG CAPS Take by mouth.   Elderberry 500 MG CAPS Take by mouth.   famotidine  (PEPCID ) 20 MG tablet Take 1 tablet (20 mg total) by mouth at bedtime.   fexofenadine  (ALLEGRA ) 180 MG tablet Take 1 tablet (180 mg total) by mouth daily.   fluticasone  (FLONASE ) 50 MCG/ACT nasal spray 1-2 spray in each nostril once or twice day   lisinopril -hydrochlorothiazide  (ZESTORETIC ) 20-25 MG tablet TAKE 1 TABLET DAILY   Omega-3 Fatty Acids (FISH OIL CONCENTRATE) 1000 MG CAPS    pantoprazole  (PROTONIX ) 40 MG tablet Take 1 tablet (40 mg total) by mouth 3 (three) times daily.   rosuvastatin  (CRESTOR ) 40 MG tablet Take 1 tablet (40 mg total) by mouth daily.   tirzepatide  (ZEPBOUND ) 7.5 MG/0.5ML Pen Inject 7.5 mg  into the skin once a week.   [DISCONTINUED] celecoxib  (CELEBREX ) 200 MG capsule Take 1 capsule (200 mg total) by mouth 2 (two) times daily.   No facility-administered encounter medications on file as of 01/31/2024.    Surgical History: Past Surgical History:  Procedure Laterality Date   BACK SURGERY Left    2004   BREAST EXCISIONAL BIOPSY Right 1997   neg   CATARACT EXTRACTION Bilateral    eye lift Left    KNEE SURGERY Left    2007 AGAIN   REPLACEMENT TOTAL KNEE     TUBAL LIGATION      Medical History: Past Medical History:  Diagnosis Date   GERD (gastroesophageal reflux disease)    Hyperlipidemia    Hypertension     Family History: Family History  Problem Relation Age of Onset   Lung cancer Mother    Lymphoma Father    Lung cancer Brother    Breast cancer Maternal Aunt 60   Ovarian cancer Paternal Aunt    Kidney cancer Maternal Grandmother    Breast cancer Paternal Grandmother     Social History   Socioeconomic History   Marital status: Married    Spouse name: Not on file   Number of children: Not on file   Years of education: Not on file   Highest education level: Not on file  Occupational History   Not on file  Tobacco Use   Smoking status: Never    Passive exposure: Never   Smokeless tobacco: Never  Substance and Sexual Activity   Alcohol use: Yes    Alcohol/week: 1.0 standard drink of alcohol    Types: 1 Glasses of wine per week    Comment: occ.   Drug use: Never   Sexual activity: Not on file  Other Topics Concern   Not on file  Social History Narrative   Not on file   Social Drivers of Health   Financial Resource Strain: Not on file  Food Insecurity: Not on file  Transportation Needs: Not on file  Physical Activity: Not on file  Stress: Not on file  Social Connections: Not on file  Intimate Partner Violence: Not on file      Review of Systems  Constitutional:  Positive for appetite change and unexpected weight change. Negative for  fatigue.  HENT: Negative.  Negative for congestion.   Respiratory:  Negative for cough, chest tightness, shortness of breath and wheezing.   Cardiovascular: Negative.  Negative for chest pain and palpitations.  Genitourinary: Negative.   Musculoskeletal: Negative.   Neurological: Negative.     Vital Signs: BP 110/70   Pulse 71   Temp 98.2 F (36.8 C)   Resp 16   Ht 5' 3 (1.6 m)   Wt 140 lb 12.8 oz (63.9 kg)   SpO2 95%   BMI 24.94 kg/m    Physical Exam Vitals reviewed.  Constitutional:      General: She is not in acute distress.    Appearance: Normal appearance. She is obese. She is not ill-appearing.  HENT:     Head: Normocephalic and atraumatic.  Eyes:     Pupils: Pupils are equal, round, and reactive to light.  Cardiovascular:     Rate and Rhythm: Normal rate and regular rhythm.  Pulmonary:     Effort: Pulmonary effort is normal. No respiratory distress.  Neurological:     Mental Status: She is alert and oriented to person, place, and time.  Psychiatric:        Mood and Affect: Mood normal.        Behavior: Behavior normal.        Assessment/Plan: 1. Primary hypertension (Primary) Stable, BP has been running borderline low, will try taking a half tablet daily of lisinopril -hydrochlorothiazide  daily.  2. Chronic left hip pain Take celebrex  as prescribed for joint pain  - celecoxib  (CELEBREX ) 200 MG capsule; Take 1 capsule (200 mg total) by mouth 2 (two) times daily.  Dispense: 180 capsule; Refill: 1  3. Primary osteoarthritis of both hands Celebrex  prescribed  - celecoxib  (CELEBREX ) 200 MG capsule; Take 1 capsule (200 mg total) by mouth 2 (two) times daily.  Dispense: 180 capsule; Refill: 1  4. Overweight with body mass index (BMI) of 26 to 26.9 in adult Zepbound  dose increased, take as prescribed.  - tirzepatide  (ZEPBOUND ) 10 MG/0.5ML Pen; Inject 10 mg into the skin once a week.  Dispense: 6 mL; Refill: 1   General Counseling: Giavonni verbalizes  understanding of the findings of todays visit and agrees with plan of treatment. I have discussed any further diagnostic evaluation that may be needed or ordered today. We also reviewed her medications today. she has been encouraged to call the office with any questions or concerns that should arise related to todays visit.    No orders of the defined types were placed in this encounter.   Meds ordered this encounter  Medications  celecoxib  (CELEBREX ) 200 MG capsule    Sig: Take 1 capsule (200 mg total) by mouth 2 (two) times daily.    Dispense:  180 capsule    Refill:  1    Fill new script today    Return in about 3 months (around 05/02/2024) for F/U, Venkat Ankney PCP.   Total time spent:30 Minutes Time spent includes review of chart, medications, test results, and follow up plan with the patient.   [place] Controlled Substance Database was reviewed by me.  This patient was seen by Mardy Maxin, FNP-C in collaboration with Dr. Sigrid Bathe as a part of collaborative care agreement.   Chandler Swiderski R. Maxin, MSN, FNP-C Internal medicine

## 2024-02-01 ENCOUNTER — Encounter: Payer: Self-pay | Admitting: Nurse Practitioner

## 2024-05-01 ENCOUNTER — Ambulatory Visit: Admitting: Nurse Practitioner

## 2024-08-02 ENCOUNTER — Ambulatory Visit: Payer: Medicare Other | Admitting: Nurse Practitioner

## 2024-08-06 ENCOUNTER — Ambulatory Visit: Admitting: Nurse Practitioner
